# Patient Record
Sex: Male | Born: 1965 | Hispanic: No | Marital: Married | Smoking: Never smoker
Health system: Southern US, Community
[De-identification: ages and names within clinical notes are randomized; demographics above are authoritative.]

## PROBLEM LIST (undated history)

## (undated) DIAGNOSIS — I10 Essential (primary) hypertension: Secondary | ICD-10-CM

## (undated) DIAGNOSIS — N200 Calculus of kidney: Secondary | ICD-10-CM

## (undated) HISTORY — DX: Essential (primary) hypertension: I10

## (undated) HISTORY — DX: Calculus of kidney: N20.0

---

## 2001-02-23 ENCOUNTER — Emergency Department (HOSPITAL_COMMUNITY): Admission: EM | Admit: 2001-02-23 | Discharge: 2001-02-23 | Payer: Self-pay | Admitting: Emergency Medicine

## 2008-05-05 ENCOUNTER — Ambulatory Visit (HOSPITAL_COMMUNITY): Admission: RE | Admit: 2008-05-05 | Discharge: 2008-05-05 | Payer: Self-pay | Admitting: Urology

## 2009-09-22 IMAGING — CT CT ABD-PELV W/O CM
4 of 8 series · 12 of 46 positions shown, 19 images · non-contrast
Comparison: NONE

CLINICAL DATA: Abdominal pain times 2 days. 

CT ABDOMEN AND PELVIS WITHOUT AND WITH INTRAVENOUS CONTRAST
TECHNIQUE: Multiple axial images were obtained from the 
diaphragm through the pelvis. COMPARISON:None.

[Series 4: venous · axial · portal-venous · 0.67mm/px · z∈[+686,+956]mm · 6 of 76 slices shown, 11 images]
[im 11/76  soft-tissue]
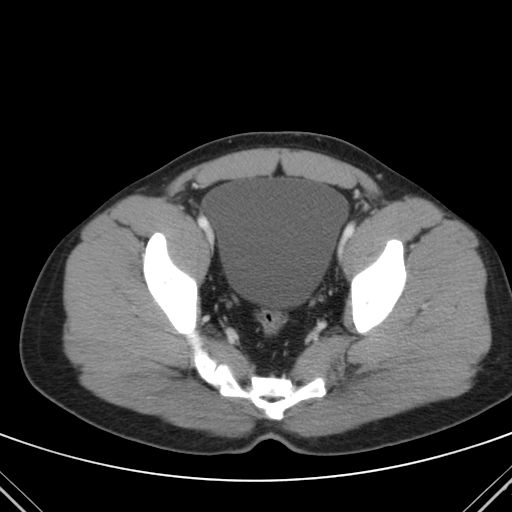
[im 11/76  bone]
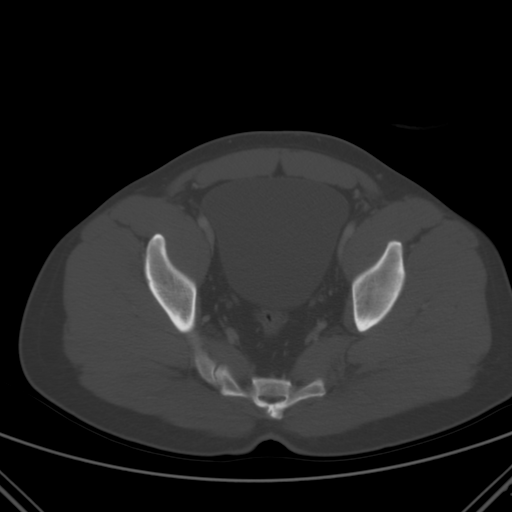
[im 22/76  soft-tissue]
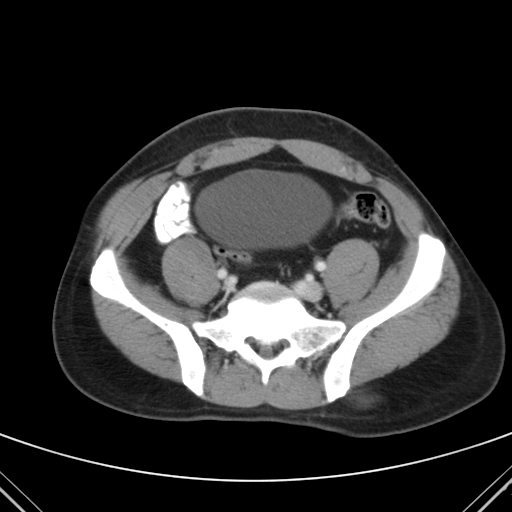
[im 33/76  soft-tissue]
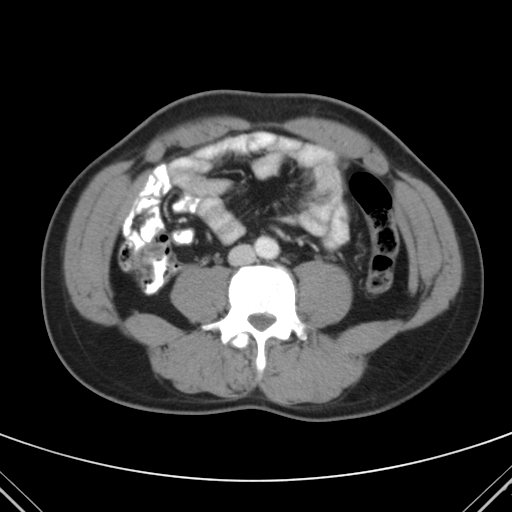
[im 33/76  lung]
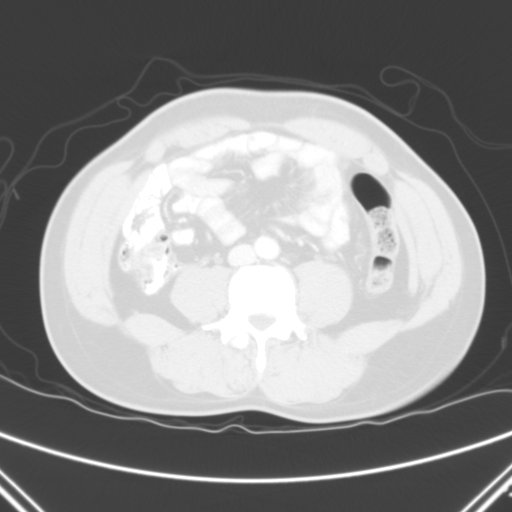
[im 43/76  soft-tissue]
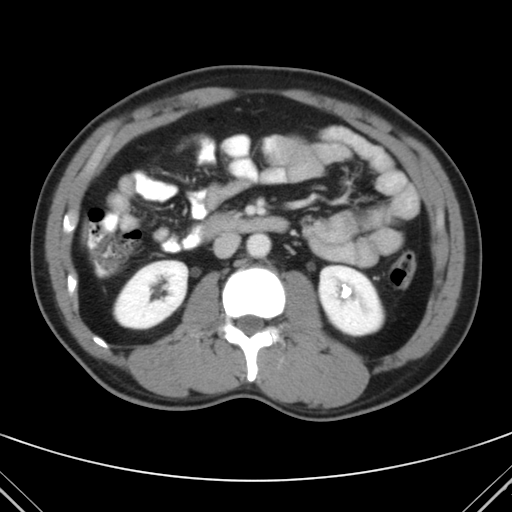
[im 43/76  lung]
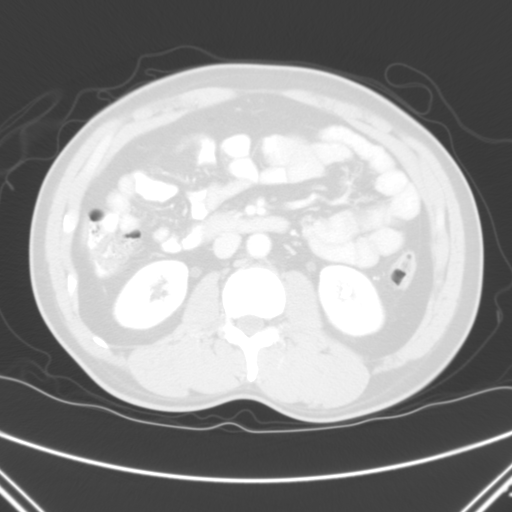
[im 54/76  soft-tissue]
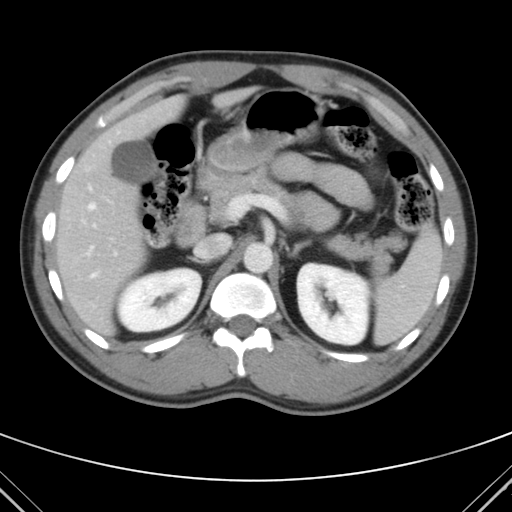
[im 54/76  lung]
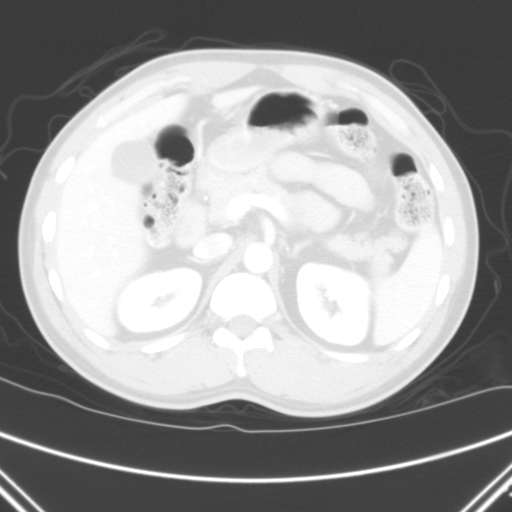
[im 65/76  soft-tissue]
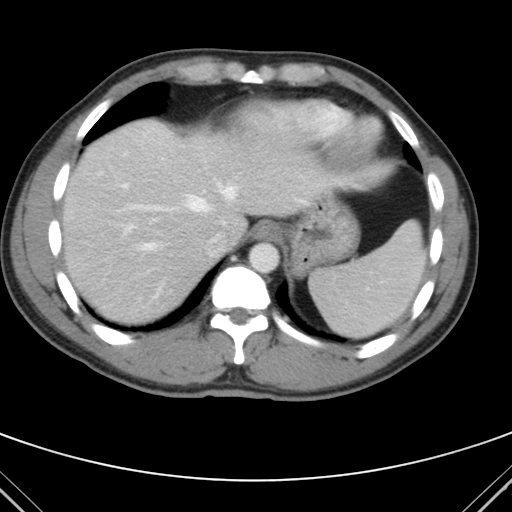
[im 65/76  lung]
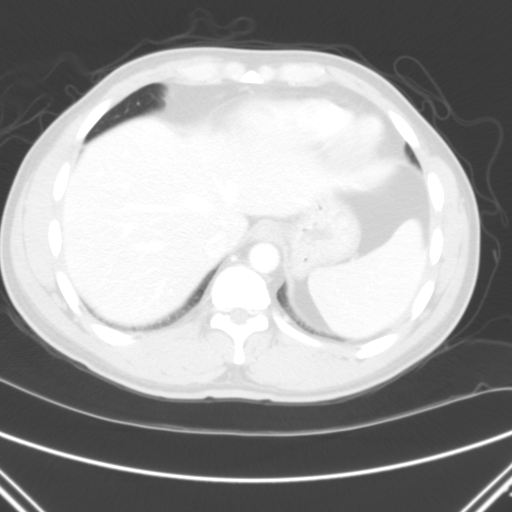

[Series 9: delays · axial · 0.68mm/px · z∈[+686,+741]mm · 2 of 76 slices shown]
[im 11/76  soft-tissue]
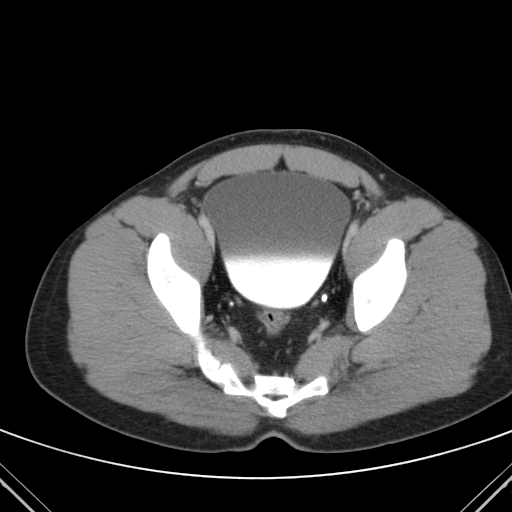
[im 22/76  soft-tissue]
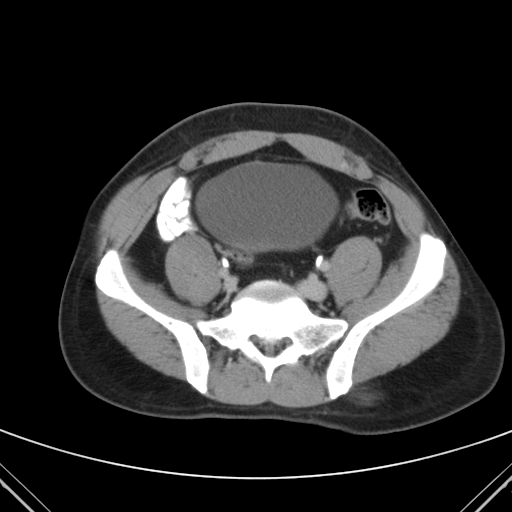

[Series 8050: coronals · coronal · 0.73mm/px · 3 of 81 slices shown, 4 images]
[im 27/81  soft-tissue]
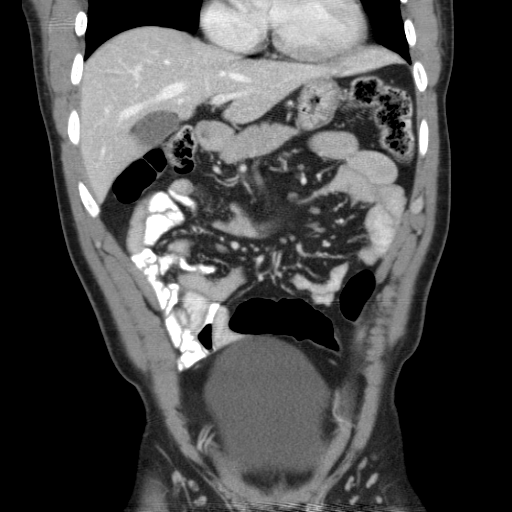
[im 36/81  soft-tissue]
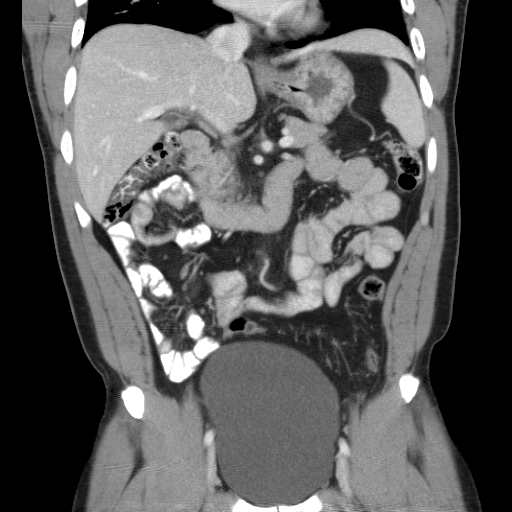
[im 36/81  bone]
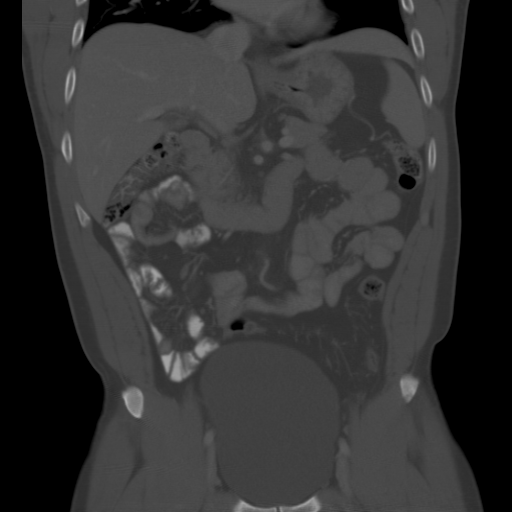
[im 45/81  soft-tissue]
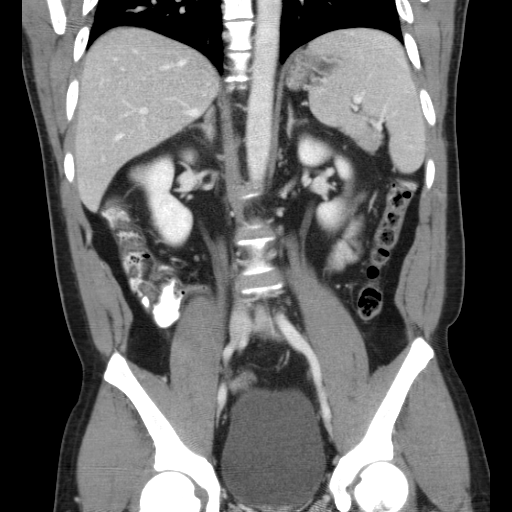

[Series 8051: sagittals · sagittal · 0.73mm/px · 1 of 96 slices shown, 2 images]
[im 32/96  soft-tissue]
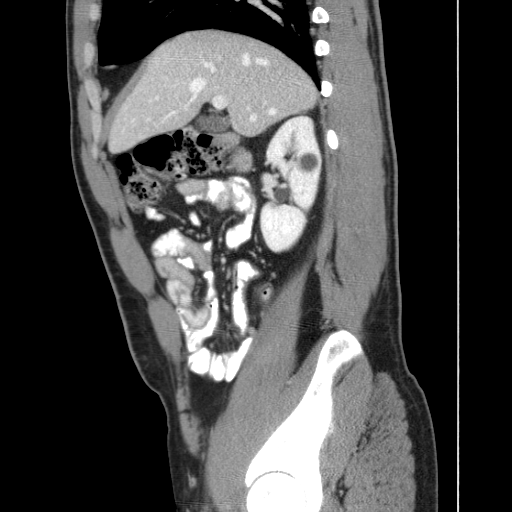
[im 32/96  bone]
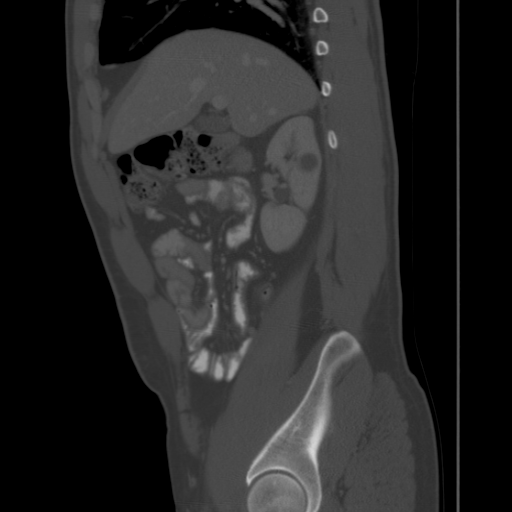

[12 of 46 positions shown; findings below may reference images not displayed]

FINDINGS: Lung bases and pleural cavities are normal.  The liver, 
spleen, pancreas, and gallbladder are normal.  Multiple renal 
cysts; the largest measures 2.5 cm.  Several nonobstructing stones 
in the lower pole of the left kidney; the largest measures 8.0 mm. 
 Normal appendix.  No adenopathy or ascites.  No bowel obstruction 
or diverticulosis.  Mild multilevel lumbar spondylosis without 
fractures or bone destruction.
IMPRESSION: Bilateral renal cysts.  Nonobstructing left renal 
calculi. No acute disease. Zegulina, Karolina Misiulyte., M.D. Ali Poloko Bw Sarefo 
04/11/08Trans Date: 04/11/2008 RTOYOTA JOSHJAX  JLM

## 2010-04-20 ENCOUNTER — Emergency Department (HOSPITAL_COMMUNITY): Admission: EM | Admit: 2010-04-20 | Discharge: 2010-04-20 | Payer: Self-pay | Admitting: Family Medicine

## 2010-11-26 LAB — POCT URINALYSIS DIPSTICK
Glucose, UA: NEGATIVE mg/dL
Ketones, ur: NEGATIVE mg/dL
Specific Gravity, Urine: 1.02 (ref 1.005–1.030)

## 2011-05-30 ENCOUNTER — Ambulatory Visit (HOSPITAL_COMMUNITY)
Admission: RE | Admit: 2011-05-30 | Discharge: 2011-05-30 | Disposition: A | Discharge status: Home / Self Care | Payer: BC Managed Care – PPO | Source: Ambulatory Visit | Attending: Urology | Admitting: Urology

## 2011-05-30 DIAGNOSIS — N200 Calculus of kidney: Secondary | ICD-10-CM | POA: Insufficient documentation

## 2012-11-09 IMAGING — CR DG ABDOMEN 1V
1 series · 1 of 1 positions shown · non-contrast
Comparison: 04/19/2011 CT

CLINICAL DATA: Preoperative radiograph, left renal stone.

ABDOMEN - 1 VIEW

[t abdomen supine]
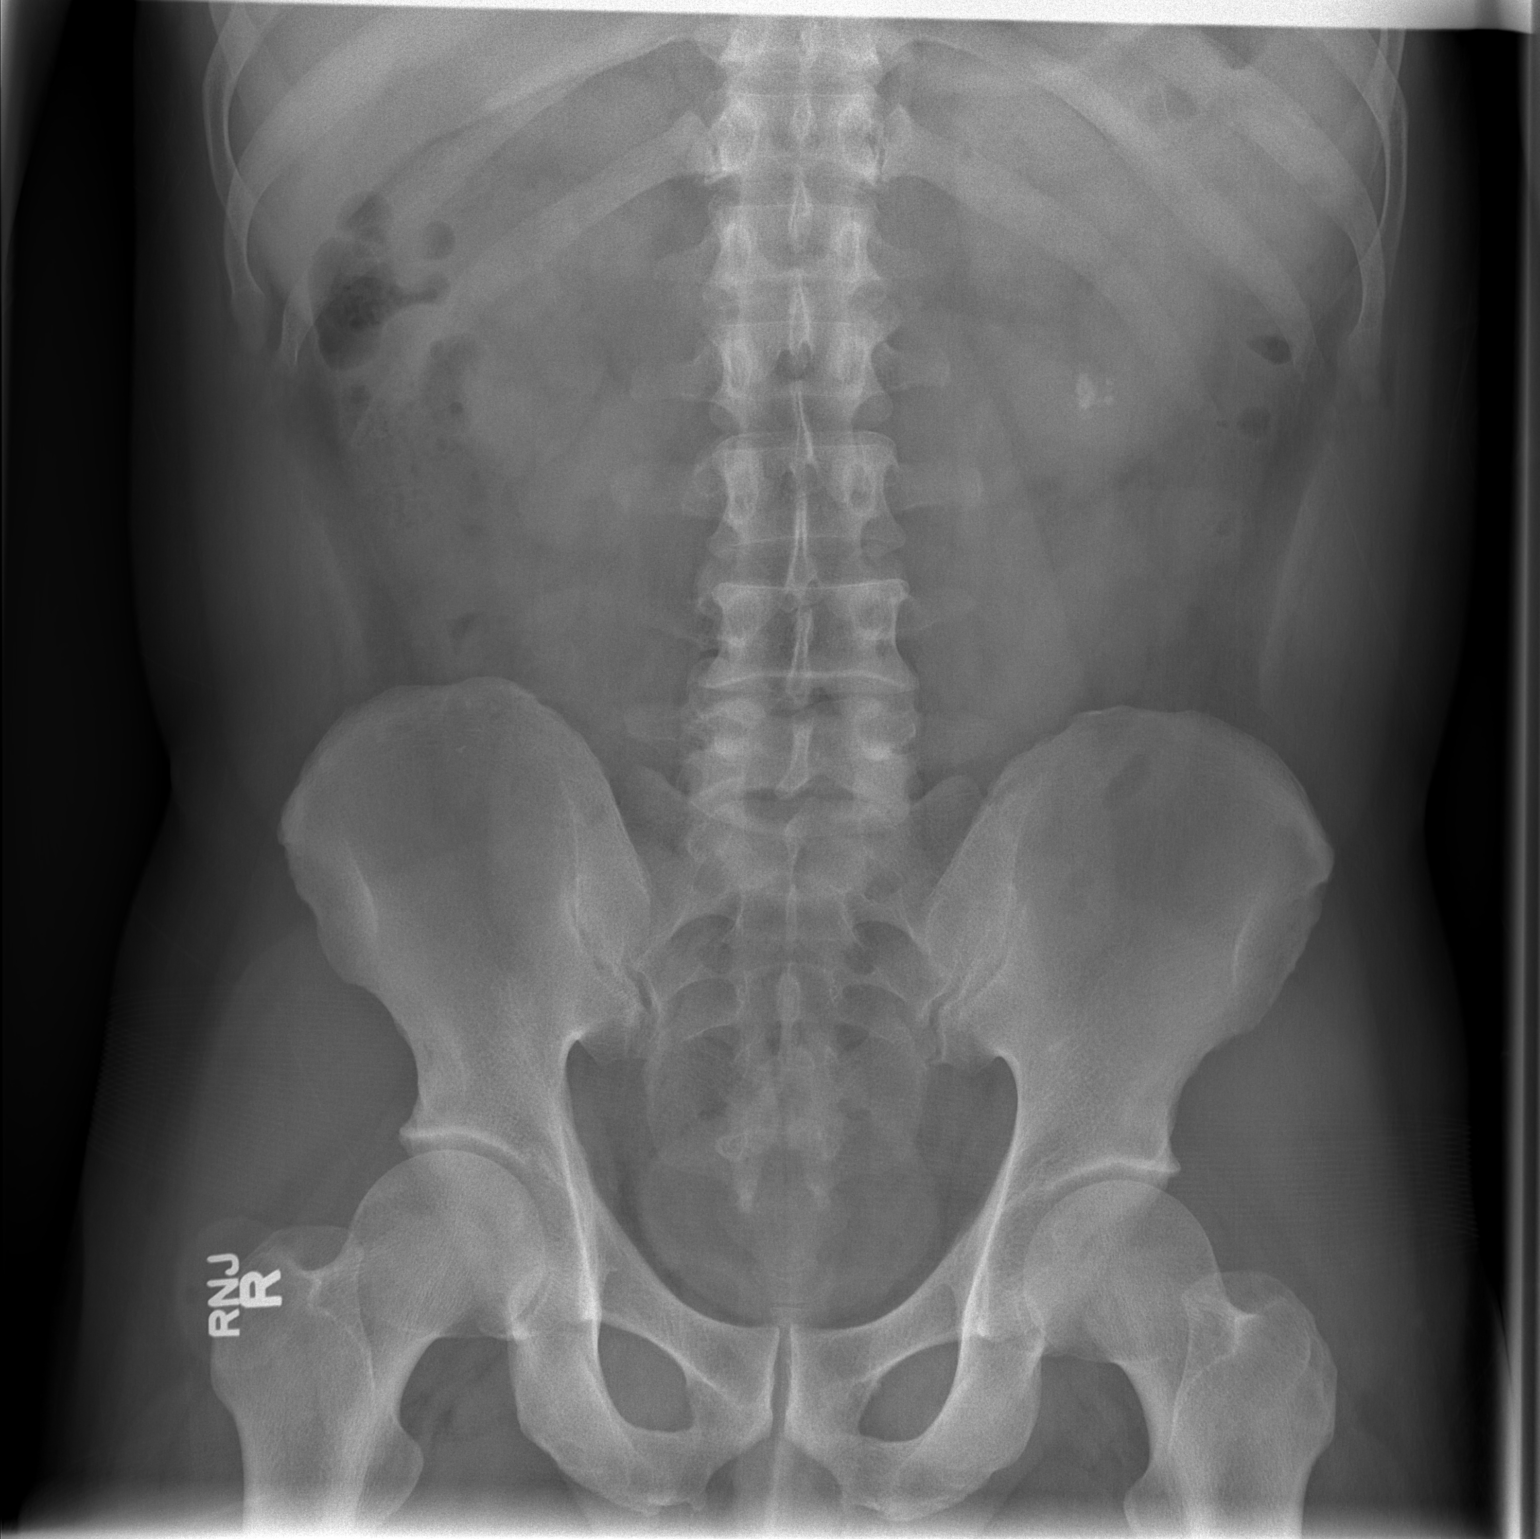

[1 of 1 positions shown; findings below may reference images not displayed]

FINDINGS: There are several calcific densities projecting over the
lower pole left renal shadow, the largest of which measures 11 mm.
Nonobstructive bowel gas pattern.  Organ outlines normal where seen
otherwise.  No acute osseous abnormality.
IMPRESSION: Several calcific densities projecting over the lower pole left
kidney, the largest of which measures 11 mm.

## 2014-07-25 ENCOUNTER — Ambulatory Visit (INDEPENDENT_AMBULATORY_CARE_PROVIDER_SITE_OTHER): Payer: BC Managed Care – PPO | Admitting: Emergency Medicine

## 2014-07-25 VITALS — BP 132/90 | HR 75 | Temp 98.2°F | Resp 18 | Ht 61.0 in | Wt 151.2 lb

## 2014-07-25 DIAGNOSIS — I10 Essential (primary) hypertension: Secondary | ICD-10-CM

## 2014-07-25 LAB — LIPID PANEL
CHOLESTEROL: 160 mg/dL (ref 0–200)
HDL: 40 mg/dL (ref >39)
LDL CALC: 85 mg/dL (ref 0–99)
Total CHOL/HDL Ratio: 4 Ratio
Triglycerides: 174 mg/dL — ABNORMAL HIGH (ref <150)
VLDL: 35 mg/dL (ref 0–40)

## 2014-07-25 LAB — POCT CBC
GRANULOCYTE PERCENT: 63.7 % (ref 37–80)
HEMATOCRIT: 42 % — AB (ref 43.5–53.7)
HEMOGLOBIN: 12.7 g/dL — AB (ref 14.1–18.1)
Lymph, poc: 2.1 (ref 0.6–3.4)
MCH, POC: 22.2 pg — AB (ref 27–31.2)
MCHC: 30.2 g/dL — AB (ref 31.8–35.4)
MCV: 73.5 fL — AB (ref 80–97)
MID (cbc): 0.5 (ref 0–0.9)
MPV: 7.9 fL (ref 0–99.8)
POC GRANULOCYTE: 4.5 (ref 2–6.9)
POC LYMPH PERCENT: 29.9 %L (ref 10–50)
POC MID %: 6.4 %M (ref 0–12)
Platelet Count, POC: 257 10*3/uL (ref 142–424)
RBC: 5.72 M/uL (ref 4.69–6.13)
RDW, POC: 13.7 %
WBC: 7.1 10*3/uL (ref 4.6–10.2)

## 2014-07-25 LAB — POCT URINALYSIS DIPSTICK
Bilirubin, UA: NEGATIVE
GLUCOSE UA: NEGATIVE
KETONES UA: NEGATIVE
LEUKOCYTES UA: NEGATIVE
Nitrite, UA: NEGATIVE
PROTEIN UA: NEGATIVE
SPEC GRAV UA: 1.02
Urobilinogen, UA: 0.2
pH, UA: 6

## 2014-07-25 LAB — COMPREHENSIVE METABOLIC PANEL
ALBUMIN: 4.5 g/dL (ref 3.5–5.2)
ALK PHOS: 79 U/L (ref 39–117)
ALT: 31 U/L (ref 0–53)
AST: 23 U/L (ref 0–37)
BILIRUBIN TOTAL: 0.9 mg/dL (ref 0.2–1.2)
BUN: 11 mg/dL (ref 6–23)
CO2: 27 meq/L (ref 19–32)
Calcium: 9.4 mg/dL (ref 8.4–10.5)
Chloride: 102 mEq/L (ref 96–112)
Creat: 0.81 mg/dL (ref 0.50–1.35)
GLUCOSE: 82 mg/dL (ref 70–99)
POTASSIUM: 3.8 meq/L (ref 3.5–5.3)
SODIUM: 140 meq/L (ref 135–145)
TOTAL PROTEIN: 7.9 g/dL (ref 6.0–8.3)

## 2014-07-25 MED ORDER — LISINOPRIL 20 MG PO TABS: 20.0000 mg | ORAL_TABLET | Freq: Every day | ORAL | Status: DC

## 2014-07-25 NOTE — Progress Notes (Signed)
[facility] [address], [place] [id number] [phone]  Date:  07/25/2014   Name:  Daniel Dougherty   DOB:  01/08/1966   MRN:  [id number]  PCP:  No PCP Per Patient    Chief Complaint: Hypertension   History of Present Illness:  Daniel Dougherty is a 48 y.o. very pleasant male patient who presents with the following:  Patient has a written record of elevated blood pressures since June ranging in the 160/104 range consistently Over the past month, he has developed global headaches that are persistent and are intermittently associated with mild dizziness He describes no neuro or visual symptoms.  No chest pain or shortness of breath. No improvement with over the counter medications or other home remedies. Denies other complaint or health concern today.   There are no active problems to display for this patient.   Past Medical History  Diagnosis Date  . Hypertension   . Kidney stones     History reviewed. No pertinent past surgical history.  History  Substance Use Topics  . Smoking status: Never Smoker   . Smokeless tobacco: Not on file  . Alcohol Use: 0.0 oz/week    0 Not specified per week     Comment: Sometimes    History reviewed. No pertinent family history.  No Known Allergies  Medication list has been reviewed and updated.  No current outpatient prescriptions on file prior to visit.   No current facility-administered medications on file prior to visit.    Review of Systems:  As per HPI, otherwise negative.    Physical Examination: Filed Vitals:   07/25/14 0855  BP: 132/90  Pulse: 75  Temp: 98.2 F (36.8 C)  Resp: 18   Filed Vitals:   07/25/14 0855  Height: 5\' 1"  (1.549 m)  Weight: 151 lb 3.2 oz (68.584 kg)   Body mass index is 28.58 kg/(m^2). Ideal Body Weight: Weight in (lb) to have BMI = 25: 132  GEN: WDWN, NAD, Non-toxic, A & O x 3 HEENT: Atraumatic, Normocephalic. Neck supple. No masses, No LAD. Ears and Nose: No external  deformity. CV: RRR, No M/G/R. No JVD. No thrill. No extra heart sounds. PULM: CTA B, no wheezes, crackles, rhonchi. No retractions. No resp. distress. No accessory muscle use. ABD: S, NT, ND, +BS. No rebound. No HSM. EXTR: No c/c/e NEURO Normal gait.  PSYCH: Normally interactive. Conversant. Not depressed or anxious appearing.  Calm demeanor.    Assessment and Plan: Hypertension  Signed,  Phillips OdorJeffery Wynter Grave, MD

## 2014-07-25 NOTE — Patient Instructions (Signed)
T?ng huy?t p (Hypertension) T?ng huy?t p, th??ng ???c g?i l huy?t p cao, l khi l?c b?m mu qua ??ng m?ch c?a qu v? qu m?nh. ??ng m?ch c?a qu v? l cc m?ch mu mang mu t? tim ?i kh?p c? th? c?a qu v?. K?t qu? ?o huy?t p c m?t con s? cao v m?t con s? th?p, ch?ng h?n 110/72. Con s? cao (tm thu) l p l?c bn trong ??ng m?ch khi tim qu v? b?m. Con s? th?p (tm tr??ng) l p l?c bn trong ??ng m?ch khi tim qu v? gin ra. Huy?t p l t??ng c?n cho qu v? ph?i l d??i 120/80. Ch?ng t?ng huy?t p bu?c tim qu v? ph?i lm vi?c v?t v? h?n ?? b?m mu. ??ng m?ch c?a qu v? c th? b? h?p ho?c c?ng. Ch?ng t?ng huy?t p lm qu v? c nguy c? b? b?nh tim, ??t qu? v cc v?n ?? khc.  CC Y?U T? NGUY C? M?t s? y?u t? nguy c? d?n ??n huy?t p cao c th? ki?m sot ???c. M?t s? y?u t? khc th khng.  Nh?ng y?u t? nguy c? khng th? ki?m sot ???c bao g?m:   Ch?ng t?c. Qu v? c nguy c? cao h?n n?u qu v? l ng??i M? g?c Phi.  ?? tu?i. Nguy c? t?ng ln theo ?? tu?i.  Gi?i tnh. Nam gi?i c nguy c? cao h?n ph? n? tr??c tu?i 45. Sau tu?i 65, ph? n? c nguy c? cao h?n nam gi?i. Nh?ng y?u t? nguy c? c th? ki?m sot ???c bao g?m:  Khng t?p th? d?c ho?c cc ho?t ??ng th? ch?t ??y ??.  Th?a cn.  ?n qu nhi?u ch?t bo, ???ng, ca-lo, ho?c mu?i.  U?ng qu nhi?u r??u. D?U HI?U V TRI?U CH?NG T?ng huy?t p th??ng khng gy ra d?u hi?u ho?c tri?u ch?ng. Huy?t p r?t cao (c?n cao huy?t p) c th? gy ?au ??u, lo l?ng, kh th?, v ch?y mu cam. CH?N ?ON  ?? ki?m tra xem qu v? c t?ng huy?t p khng, chuyn gia ch?m sc s?c kh?e c?a qu v? s? ?o huy?t p trong khi qu v? ng?i ??t tay ? m?c ngang v?i tim. Huy?t p c?n ???c ?o t nh?t hai l?n trn cng m?t cnh tay. M?t s? tnh tr?ng nh?t ??nh c th? lm cho huy?t p khc nhau gi?a tay ph?i v tay tri c?a qu v?. K?t qu? ?o huy?t p cao h?n bnh th??ng ? m?t th?i ?i?m no ? khng c ngh?a l qu v? c?n ?i?u tr?. N?u k?t qu? ?o huy?t p cao, hy h?i chuyn  gia ch?m sc s?c kh?e v? vi?c ki?m tra l?i huy?t p. ?I?U TR?  ?i?u tr? huy?t p cao gao g?m thay ??i l?i s?ng v c th? ph?i dng thu?c. C m?t l?i s?ng lnh m?nh c th? gip lm gi?m huy?t p cao. Qu v? c th? c?n thay ??i m?t s? thi quen. Thay ??i l?i s?ng c th? bao g?m:  Th?c hi?n ch? ?? ?n DASH. Ch? ?? ?n ny c nhi?u tri cy, rau, v ng? c?c nguyn h?t. C t mu?i, th?t ??, v t b? sung ???ng.  Hy dnh 2 1/2 ti?ng ho?t ??ng thn th? nhanh m?i tu?n.  Gi?m cn n?u c?n thi?t.  Khng ht thu?c.  H?n ch? ?? u?ng c c?n.  H?c cc cch gi?m c?ng th?ng. N?u thay ??i l?i s?ng khng ?? ?? ??a huy?t p v? m?c c th? ki?m sot ???  c, chuyn gia ch?m sc s?c kh?e c th? k ??n thu?c. Qu v? c th? c?n dng nhi?u lo?i thu?c. Ph?i h?p ch?t ch? v?i chuyn gia ch?m sc s?c kh?e ?? tm hi?u cc nguy c? v l?i ch. H??NG D?N CH?M SC T?I NH  Ki?m tra l?i huy?t p c?a qu v? theo ch? d?n c?a chuyn gia ch?m sc s?c kh?e.  Ch? s? d?ng thu?c theo ch? d?n c?a chuyn gia ch?m sc s?c kh?e. Lm theo ch? d?n m?t cch c?n th?n. Thu?c ?i?u tr? huy?t p ph?i ???c dng theo ??n ? k. Thu?c c?ng s? khng c tc d?ng khi qu v? b? li?u. Vi?c b? li?u thu?c c?ng lm qu v? c nguy c? pht sinh v?n ??.  Khng ht thu?c.  Theo di huy?t p c?a qu v? ? nh theo ch? d?n c?a chuyn gia ch?m sc s?c kh?e. ?I KHM N?U:   Qu v? ngh? qu v? c ph?n ?ng v?i thu?c ?ang dng.  Qu v? b? ?au ??u ho?c c?m th?y chng m?t ti di?n.  Qu v? b? s?ng ph ? m?t c chn.  Qu v? c v?n ?? v? th? l?c. NGAY L?P T?C ?I KHM N?U:  Qu v? b? ?au ??u n?ng ho?c l l?n.  Qu v? b? y?u b?t th??ng, t b, ho?c c?m th?y nh? ng?t x?u.  Qu v? b? ?au ng?c ho?c ?au b?ng r?t nhi?u.  Qu v? nn nhi?u l?n.  Qu v? b? kh th?. ??M B?O QU V?:   Hi?u r cc h??ng d?n ny.  S? theo di tnh tr?ng c?a mnh.  S? yu c?u tr? gip ngay l?p t?c n?u qu v? c?m th?y khng kh?e ho?c th?y tr?m tr?ng h?n. Document Released:  08/29/2005 Document Revised: 01/13/2014 Exit[employer] Patient Information 2015 [employer]. This information is not intended to replace advice given to you by your health care provider. Make sure you discuss any questions you have with your health care provider.  

## 2015-05-15 ENCOUNTER — Ambulatory Visit (INDEPENDENT_AMBULATORY_CARE_PROVIDER_SITE_OTHER): Payer: Self-pay | Admitting: Emergency Medicine

## 2015-05-15 ENCOUNTER — Other Ambulatory Visit: Payer: Self-pay | Admitting: Physician Assistant

## 2015-05-15 VITALS — BP 130/84 | HR 65 | Temp 98.4°F | Resp 16 | Ht 61.0 in | Wt 147.0 lb

## 2015-05-15 DIAGNOSIS — R42 Dizziness and giddiness: Secondary | ICD-10-CM

## 2015-05-15 DIAGNOSIS — Z87442 Personal history of urinary calculi: Secondary | ICD-10-CM

## 2015-05-15 DIAGNOSIS — Z8679 Personal history of other diseases of the circulatory system: Secondary | ICD-10-CM

## 2015-05-15 LAB — CBC WITH DIFFERENTIAL/PLATELET
BASOS ABS: 0 10*3/uL (ref 0.0–0.1)
Basophils Relative: 0 % (ref 0–1)
EOS PCT: 2 % (ref 0–5)
Eosinophils Absolute: 0.1 10*3/uL (ref 0.0–0.7)
HEMATOCRIT: 40.9 % (ref 39.0–52.0)
Hemoglobin: 12.8 g/dL — ABNORMAL LOW (ref 13.0–17.0)
LYMPHS PCT: 34 % (ref 12–46)
Lymphs Abs: 2.5 10*3/uL (ref 0.7–4.0)
MCH: 22.5 pg — ABNORMAL LOW (ref 26.0–34.0)
MCHC: 31.3 g/dL (ref 30.0–36.0)
MCV: 71.9 fL — ABNORMAL LOW (ref 78.0–100.0)
MPV: 10.3 fL (ref 8.6–12.4)
Monocytes Absolute: 0.5 10*3/uL (ref 0.1–1.0)
Monocytes Relative: 7 % (ref 3–12)
Neutro Abs: 4.2 10*3/uL (ref 1.7–7.7)
Neutrophils Relative %: 57 % (ref 43–77)
PLATELETS: 299 10*3/uL (ref 150–400)
RBC: 5.69 MIL/uL (ref 4.22–5.81)
RDW: 14.5 % (ref 11.5–15.5)
WBC: 7.3 10*3/uL (ref 4.0–10.5)

## 2015-05-15 LAB — POCT UA - MICROSCOPIC ONLY
Bacteria, U Microscopic: NEGATIVE
Casts, Ur, LPF, POC: NEGATIVE
Crystals, Ur, HPF, POC: NEGATIVE
Epithelial cells, urine per micros: NEGATIVE
MUCUS UA: NEGATIVE
WBC, UR, HPF, POC: NEGATIVE
YEAST UA: NEGATIVE

## 2015-05-15 LAB — COMPLETE METABOLIC PANEL WITH GFR
ALT: 26 U/L (ref 9–46)
AST: 19 U/L (ref 10–40)
Albumin: 4.3 g/dL (ref 3.6–5.1)
Alkaline Phosphatase: 69 U/L (ref 40–115)
BUN: 14 mg/dL (ref 7–25)
CALCIUM: 9.4 mg/dL (ref 8.6–10.3)
CHLORIDE: 100 mmol/L (ref 98–110)
CO2: 27 mmol/L (ref 20–31)
CREATININE: 0.9 mg/dL (ref 0.60–1.35)
GFR, Est Non African American: >89 mL/min (ref >=60)
Glucose, Bld: 78 mg/dL (ref 65–99)
POTASSIUM: 4 mmol/L (ref 3.5–5.3)
Sodium: 140 mmol/L (ref 135–146)
Total Bilirubin: 0.9 mg/dL (ref 0.2–1.2)
Total Protein: 7.6 g/dL (ref 6.1–8.1)

## 2015-05-15 LAB — POCT URINALYSIS DIPSTICK
Bilirubin, UA: NEGATIVE
Glucose, UA: NEGATIVE
Ketones, UA: NEGATIVE
LEUKOCYTES UA: NEGATIVE
NITRITE UA: NEGATIVE
PH UA: 5.5
PROTEIN UA: NEGATIVE
Spec Grav, UA: 1.02
UROBILINOGEN UA: 0.2

## 2015-05-15 LAB — POCT GLYCOSYLATED HEMOGLOBIN (HGB A1C): HEMOGLOBIN A1C: 4.5

## 2015-05-15 LAB — TSH: TSH: 0.443 u[IU]/mL (ref 0.350–4.500)

## 2015-05-15 NOTE — Progress Notes (Signed)
  Medical screening examination/treatment/procedure(s) were performed by non-physician practitioner and as supervising physician I was immediately available for consultation/collaboration.     

## 2015-05-15 NOTE — Patient Instructions (Signed)
I do not know why you are getting dizzy.  Please check you blood pressure when you are feeling symptoms and record this and bring this back to the office in one week.

## 2015-05-15 NOTE — Progress Notes (Signed)
05/15/2015 at 11:55 AM  Daniel Dougherty / DOB: 1965-12-27 / MRN: [id number]  The patient  does not have a problem list on file.  SUBJECTIVE  Daniel Dougherty is a 49 y.o. well appearing male presenting for the chief complaint of "head feels heavy" dizziness that occurs mostly in the morning times and last roughly thirty minutes. He denies presyncope and vertiginous symptoms with this dizziness, as well as chest pain and palpitations. He is able to function through this dizziness and is able to make coffee and perform his normal morning routine. Most recent episode was this morning and this occurs three or four times per week.  States he drinks 1-2 beers daily and sometimes more on the weekends. He does not smoke or use illlicits. He has a history of HTN and has taken Lisinopril in the past, however has not been taking this for roughly 1 year now.    He  has a past medical history of Hypertension and Kidney stones.    Medications reviewed and updated by myself where necessary, and exist elsewhere in the encounter.   [name] has No Known Allergies. He  reports that he has never smoked. He does not have any smokeless tobacco history on file. He reports that he drinks alcohol. He reports that he does not use illicit drugs. He  has no sexual activity history on file. The patient  has no past surgical history on file.  His family history is not on file.  Review of Systems  Constitutional: Negative for fever.  Eyes: Negative for blurred vision and photophobia.  Respiratory: Negative for cough.   Cardiovascular: Negative for chest pain.  Gastrointestinal: Negative for nausea.  Genitourinary: Negative for dysuria.  Skin: Negative for rash.  Neurological: Positive for dizziness (transient). Negative for focal weakness and headaches.  Endo/Heme/Allergies: Negative for polydipsia.    OBJECTIVE  His  height is 5\' 1"  (1.549 m) and weight is 147 lb (66.679 kg). His oral temperature is 98.4 F (36.9 C). His blood  pressure is 130/84 and his pulse is 65. His respiration is 16 and oxygen saturation is 99%.  The patient's body mass index is 27.79 kg/(m^2).  Physical Exam  Vitals reviewed. Constitutional: He is oriented to person, place, and time. He appears well-developed. No distress.  Eyes: EOM are normal. Pupils are equal, round, and reactive to light. No scleral icterus.  Neck: Normal range of motion.  Cardiovascular: Normal rate and regular rhythm.   Respiratory: Effort normal and breath sounds normal.  GI: He exhibits no distension.  Musculoskeletal: Normal range of motion.  Neurological: He is alert and oriented to person, place, and time. He has normal strength and normal reflexes. He displays no tremor. No cranial nerve deficit or sensory deficit. He exhibits normal muscle tone. Gait normal. GCS eye subscore is 4. GCS verbal subscore is 5. GCS motor subscore is 6.  Skin: Skin is warm and dry. No rash noted. He is not diaphoretic.  Psychiatric: He has a normal mood and affect.    Results for orders placed or performed in visit on 05/15/15 (from the past 24 hour(s))  POCT glycosylated hemoglobin (Hb A1C)     Status: None   Collection Time: 05/15/15 11:39 AM  Result Value Ref Range   Hemoglobin A1C 4.5   POCT UA - Microscopic Only     Status: None   Collection Time: 05/15/15 11:40 AM  Result Value Ref Range   WBC, Ur, HPF, POC neg'  RBC, urine, microscopic 3-6    Bacteria, U Microscopic neg    Mucus, UA neg    Epithelial cells, urine per micros neg    Crystals, Ur, HPF, POC neg    Casts, Ur, LPF, POC neg    Yeast, UA neg   POCT urinalysis dipstick     Status: None   Collection Time: 05/15/15 11:40 AM  Result Value Ref Range   Color, UA yellow    Clarity, UA clear    Glucose, UA neg    Bilirubin, UA neg    Ketones, UA neg    Spec Grav, UA 1.020    Blood, UA small    pH, UA 5.5    Protein, UA neg    Urobilinogen, UA 0.2    Nitrite, UA neg    Leukocytes, UA Negative Negative    Orthostatic VS for the past 24 hrs:  BP- Lying Pulse- Lying BP- Sitting Pulse- Sitting BP- Standing at 0 minutes Pulse- Standing at 0 minutes  05/15/15 1145 152/89 mmHg 64 (!) 155/95 mmHg 67 (!) 149/91 mmHg 76   EKG: NSR.     ASSESSMENT & PLAN  Mykal was seen today for hypertension.  Diagnoses and all orders for this visit:  Dizziness: His symptoms are non specific. His work up is reassuring. -     EKG 12-Lead -     COMPLETE METABOLIC PANEL WITH GFR -     CBC with Differential/Platelet -     TSH -     POCT glycosylated hemoglobin (Hb A1C)  History of hypertension: Patient advised to monitor his BP daily for the next 7 days, both while symptomatic and asymptomatic and return this to me.  I BP log and envelope has been provided.  He does not need to return to the office.    History of kidney stones: Patient under the care of a urologist.   -     POCT UA - Microscopic Only -     POCT urinalysis dipstick    The patient was advised to call or come back to clinic if he does not see an improvement in symptoms, or worsens with the above plan.   Deliah Boston, MHS, PA-C [facility] 05/15/2015 11:55 AM

## 2015-05-19 LAB — IRON AND TIBC
%SAT: 39 % (ref 15–60)
IRON: 140 ug/dL (ref 50–180)
TIBC: 356 ug/dL (ref 250–425)
UIBC: 216 ug/dL (ref 125–400)

## 2015-06-02 ENCOUNTER — Other Ambulatory Visit: Payer: Self-pay | Admitting: Physician Assistant

## 2015-06-02 DIAGNOSIS — D509 Iron deficiency anemia, unspecified: Secondary | ICD-10-CM

## 2015-06-05 ENCOUNTER — Other Ambulatory Visit: Payer: Self-pay | Admitting: Physician Assistant

## 2015-06-05 ENCOUNTER — Other Ambulatory Visit: Payer: Self-pay

## 2015-06-05 DIAGNOSIS — I10 Essential (primary) hypertension: Secondary | ICD-10-CM

## 2015-06-05 DIAGNOSIS — D509 Iron deficiency anemia, unspecified: Secondary | ICD-10-CM

## 2015-06-05 MED ORDER — LISINOPRIL 5 MG PO TABS: 5.0000 mg | ORAL_TABLET | Freq: Every day | ORAL | Status: DC

## 2015-06-05 NOTE — Progress Notes (Signed)
Patient returned BP log with BP averaging 136/91.  Will start low dose lisinopril.  Deliah Boston, MS, PA-C   10:04 AM, 06/05/2015

## 2015-06-08 ENCOUNTER — Other Ambulatory Visit: Payer: Self-pay | Admitting: Physician Assistant

## 2015-06-08 LAB — PATHOLOGIST SMEAR REVIEW

## 2015-06-09 ENCOUNTER — Other Ambulatory Visit: Payer: Self-pay | Admitting: Physician Assistant

## 2015-06-09 DIAGNOSIS — D569 Thalassemia, unspecified: Secondary | ICD-10-CM | POA: Insufficient documentation

## 2016-08-08 ENCOUNTER — Other Ambulatory Visit: Payer: Self-pay | Admitting: Physician Assistant

## 2016-08-08 ENCOUNTER — Ambulatory Visit (INDEPENDENT_AMBULATORY_CARE_PROVIDER_SITE_OTHER): Payer: Managed Care, Other (non HMO) | Admitting: Physician Assistant

## 2016-08-08 VITALS — BP 140/90 | HR 66 | Temp 98.2°F | Resp 16 | Ht 61.0 in | Wt 146.6 lb

## 2016-08-08 DIAGNOSIS — R42 Dizziness and giddiness: Secondary | ICD-10-CM

## 2016-08-08 DIAGNOSIS — R1012 Left upper quadrant pain: Secondary | ICD-10-CM

## 2016-08-08 DIAGNOSIS — R030 Elevated blood-pressure reading, without diagnosis of hypertension: Secondary | ICD-10-CM

## 2016-08-08 DIAGNOSIS — R110 Nausea: Secondary | ICD-10-CM | POA: Diagnosis not present

## 2016-08-08 DIAGNOSIS — K529 Noninfective gastroenteritis and colitis, unspecified: Secondary | ICD-10-CM | POA: Diagnosis not present

## 2016-08-08 LAB — POCT CBC
Granulocyte percent: 76.1 %G (ref 37–80)
HEMATOCRIT: 38.1 % — AB (ref 43.5–53.7)
HEMOGLOBIN: 12.6 g/dL — AB (ref 14.1–18.1)
LYMPH, POC: 1.8 (ref 0.6–3.4)
MCH, POC: 23.5 pg — AB (ref 27–31.2)
MCHC: 33.2 g/dL (ref 31.8–35.4)
MCV: 70.8 fL — AB (ref 80–97)
MID (CBC): 0.6 (ref 0–0.9)
MPV: 7.5 fL (ref 0–99.8)
POC GRANULOCYTE: 7.6 — AB (ref 2–6.9)
POC LYMPH %: 18.3 % (ref 10–50)
POC MID %: 5.6 % (ref 0–12)
Platelet Count, POC: 248 10*3/uL (ref 142–424)
RBC: 5.38 M/uL (ref 4.69–6.13)
RDW, POC: 13.1 %
WBC: 10 10*3/uL (ref 4.6–10.2)

## 2016-08-08 LAB — COMPLETE METABOLIC PANEL WITH GFR
ALBUMIN: 4.5 g/dL (ref 3.6–5.1)
ALK PHOS: 60 U/L (ref 40–115)
ALT: 18 U/L (ref 9–46)
AST: 19 U/L (ref 10–35)
BILIRUBIN TOTAL: 1.1 mg/dL (ref 0.2–1.2)
BUN: 14 mg/dL (ref 7–25)
CALCIUM: 9.3 mg/dL (ref 8.6–10.3)
CO2: 28 mmol/L (ref 20–31)
CREATININE: 0.85 mg/dL (ref 0.70–1.33)
Chloride: 104 mmol/L (ref 98–110)
GFR, Est Non African American: >89 mL/min (ref >=60)
Glucose, Bld: 87 mg/dL (ref 65–99)
Potassium: 4.3 mmol/L (ref 3.5–5.3)
Sodium: 141 mmol/L (ref 135–146)
TOTAL PROTEIN: 7.6 g/dL (ref 6.1–8.1)

## 2016-08-08 LAB — POCT URINALYSIS DIP (MANUAL ENTRY)
BILIRUBIN UA: NEGATIVE
Bilirubin, UA: NEGATIVE
Glucose, UA: NEGATIVE
Leukocytes, UA: NEGATIVE
Nitrite, UA: NEGATIVE
SPEC GRAV UA: 1.025
Urobilinogen, UA: 0.2
pH, UA: 6

## 2016-08-08 LAB — TSH: TSH: 0.22 mIU/L — ABNORMAL LOW (ref 0.40–4.50)

## 2016-08-08 LAB — POC MICROSCOPIC URINALYSIS (UMFC)

## 2016-08-08 LAB — LIPASE: Lipase: 9 U/L (ref 7–60)

## 2016-08-08 MED ORDER — ONDANSETRON 4 MG PO TBDP: 4.0000 mg | ORAL_TABLET | Freq: Three times a day (TID) | ORAL | 0 refills | Status: DC | PRN

## 2016-08-08 MED ORDER — DIMENHYDRINATE 50 MG PO TABS: 50.0000 mg | ORAL_TABLET | Freq: Three times a day (TID) | ORAL | 0 refills | Status: DC | PRN

## 2016-08-08 NOTE — Patient Instructions (Addendum)
Drink at least 64 oz of water daily.  You can use zofran or dramamine as needed for dizziness and nausea. You can use OTC immodium for diarrhea if it comes back.  If your symptoms get worse seek care immediately otherwise I will see you this Friday for follow up.      Viral Gastroenteritis, Adult Introduction Viral gastroenteritis is also known as the stomach flu. This condition is caused by certain germs (viruses). These germs can be passed from person to person very easily (are very contagious). This condition can cause sudden watery poop (diarrhea), fever, and throwing up (vomiting). Having watery poop and throwing up can make you feel weak and cause you to get dehydrated. Dehydration can make you tired and thirsty, make you have a dry mouth, and make it so you pee (urinate) less often. Older adults and people with other diseases or a weak defense system (immune system) are at higher risk for dehydration. It is important to replace the fluids that you lose from having watery poop and throwing up. Follow these instructions at home: Follow instructions from your doctor about how to care for yourself at home. Eating and drinking Follow these instructions as told by your doctor:  Take an oral rehydration solution (ORS). This is a drink that is sold at pharmacies and stores.  Drink clear fluids in small amounts as you are able, such as:  Water.  Ice chips.  Diluted fruit juice.  Low-calorie sports drinks.  Eat bland, easy-to-digest foods in small amounts as you are able, such as:  Bananas.  Applesauce.  Rice.  Low-fat (lean) meats.  Toast.  Crackers.  Avoid fluids that have a lot of sugar or caffeine in them.  Avoid alcohol.  Avoid spicy or fatty foods. General instructions  Drink enough fluid to keep your pee (urine) clear or pale yellow.  Wash your hands often. If you cannot use soap and water, use hand sanitizer.  Make sure that all people in your home wash  their hands well and often.  Rest at home while you get better.  Take over-the-counter and prescription medicines only as told by your doctor.  Watch your condition for any changes.  Take a warm bath to help with any burning or pain from having watery poop.  Keep all follow-up visits as told by your doctor. This is important. Contact a doctor if:  You cannot keep fluids down.  Your symptoms get worse.  You have new symptoms.  You feel light-headed or dizzy.  You have muscle cramps. Get help right away if:  You have chest pain.  You feel very weak or you pass out (faint).  You see blood in your throw-up.  Your throw-up looks like coffee grounds.  You have bloody or black poop (stools) or poop that look like tar.  You have a very bad headache, a stiff neck, or both.  You have a rash.  You have very bad pain, cramping, or bloating in your belly (abdomen).  You have trouble breathing.  You are breathing very quickly.  Your heart is beating very quickly.  Your skin feels cold and clammy.  You feel confused.  You have pain when you pee.  You have signs of dehydration, such as:  Dark pee, hardly any pee, or no pee.  Cracked lips.  Dry mouth.  Sunken eyes.  Sleepiness.  Weakness. This information is not intended to replace advice given to you by your health care provider. Make sure you discuss  any questions you have with your health care provider. Document Released: 02/15/2008 Document Revised: 03/18/2016 Document Reviewed: 05/05/2015  2017 Elsevier  IF you received an x-ray today, you will receive an invoice from [facility]. Please contact [facility] at [phone] with questions or concerns regarding your invoice.   IF you received labwork today, you will receive an invoice from [facility]. Please contact [facility] at [phone] with questions or concerns regarding your invoice.   Our billing staff  will not be able to assist you with questions regarding bills from these companies.  You will be contacted with the lab results as soon as they are available. The fastest way to get your results is to activate your My Chart account. Instructions are located on the last page of this paperwork. If you have not heard from us regarding the results in 2 weeks, please contact this office.

## 2016-08-08 NOTE — Progress Notes (Signed)
Daniel Dougherty  MRN: [id number] DOB: 08/06/1966  Subjective:  Daniel Dougherty is a 50 y.o. male seen in office today for a chief complaint of dizziness x 1 day. He had associated vomiting and diarrhea yesterday. His son is having the same issues. He took some of his son's prescirbed medication and that helped with his diarrhea and vomiting but he cannot recall what the name of the medication is. However, it did not help with the dizziness. Notes the dizziness is triggered when he sits up from a lying positions. He has not drank water since yesterday afternoon.   Of note he does not take his bp medication for hypertension because it makes him cough so he stopped taking it one-two years ago. His bp runs 140-150s systolic.   Denies smoking. Drinks alcohol occasionally.   Accompanied by his daughter who is translating for patient.  Review of Systems  Constitutional: Positive for chills and fatigue. Negative for diaphoresis and fever.  HENT: Negative for hearing loss and tinnitus.   Eyes: Negative for visual disturbance.  Respiratory: Negative for cough and shortness of breath.   Cardiovascular: Negative for chest pain and palpitations.  Gastrointestinal: Positive for abdominal pain ( intermittent). Negative for blood in stool.  Neurological: Negative for tremors, weakness and headaches.    Patient Active Problem List   Diagnosis Date Noted  . Thalassemia 06/09/2015    Current Outpatient Prescriptions on File Prior to Visit  Medication Sig Dispense Refill  . lisinopril (PRINIVIL,ZESTRIL) 5 MG tablet Take 1 tablet (5 mg total) by mouth daily. (Patient not taking: Reported on 08/08/2016) 30 tablet 6   No current facility-administered medications on file prior to visit.     No Known Allergies     Social History   Social History  . Marital status: Married    Spouse name: N/A  . Number of children: N/A  . Years of education: N/A   Occupational History  . Not on file.   Social History Main  Topics  . Smoking status: Never Smoker  . Smokeless tobacco: Never Used  . Alcohol use 0.0 oz/week     Comment: Sometimes  . Drug use: No  . Sexual activity: Not on file   Other Topics Concern  . Not on file   Social History Narrative  . No narrative on file    Objective:  BP 140/90 (BP Location: Right Arm, Patient Position: Sitting, Cuff Size: Small)   Pulse 66   Temp 98.2 F (36.8 C) (Oral)   Resp 16   Ht 5\' 1"  (1.549 m)   Wt 146 lb 9.6 oz (66.5 kg)   SpO2 98%   BMI 27.70 kg/m   Physical Exam  Constitutional: He is oriented to person, place, and time.  Well developed, well nourished, lying on exam table unwilling to move due to fear of eliciting dizziness.   HENT:  Head: Normocephalic and atraumatic.  Right Ear: Tympanic membrane, external ear and ear canal normal.  Left Ear: External ear and ear canal normal. 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  Nose: Nose normal.  Mouth/Throat: Oropharynx is clear and moist and mucous membranes are normal.  Eyes: Conjunctivae are normal.  Neck: Normal range of motion.  Cardiovascular: Normal rate, regular rhythm and normal heart sounds.   Pulmonary/Chest: Effort normal.  Abdominal: Soft. Normal appearance and bowel sounds are normal. There is tenderness in the left upper quadrant.  Neurological: He is alert and oriented to person, place, and time. He has  normal strength and intact cranial nerves. He has a normal Cerebellar Exam and a normal Romberg Test. He shows no pronator drift. Gait abnormal.  Negative Dix-Hallpike.  Dizziness elicited when he sits up on exam table or stands.   Skin: Skin is warm and dry.  Psychiatric: Affect normal.  Vitals reviewed.  Orthostatic VS for the past 24 hrs:  BP- Lying Pulse- Lying BP- Sitting Pulse- Sitting BP- Standing at 0 minutes Pulse- Standing at 0 minutes  08/08/16 1159 146/88 67 (!) 154/95 58 (!) 147/93 71     Results for orders placed or performed in visit on 08/08/16  (from the past 24 hour(s))  POCT CBC     Status: Abnormal   Collection Time: 08/08/16 11:47 AM  Result Value Ref Range   WBC 10.0 4.6 - 10.2 K/uL   Lymph, poc 1.8 0.6 - 3.4   POC LYMPH PERCENT 18.3 10 - 50 %L   MID (cbc) 0.6 0 - 0.9   POC MID % 5.6 0 - 12 %M   POC Granulocyte 7.6 (A) 2 - 6.9   Granulocyte percent 76.1 37 - 80 %G   RBC 5.38 4.69 - 6.13 M/uL   Hemoglobin 12.6 (A) 14.1 - 18.1 g/dL   HCT, POC 16.138.1 (A) 09.643.5 - 53.7 %   MCV 70.8 (A) 80 - 97 fL   MCH, POC 23.5 (A) 27 - 31.2 pg   MCHC 33.2 31.8 - 35.4 g/dL   RDW, POC 04.513.1 %   Platelet Count, POC 248 142 - 424 K/uL   MPV 7.5 0 - 99.8 fL  POCT urinalysis dipstick     Status: Abnormal   Collection Time: 08/08/16 12:09 PM  Result Value Ref Range   Color, UA yellow yellow   Clarity, UA clear clear   Glucose, UA negative negative   Bilirubin, UA negative negative   Ketones, POC UA negative negative   Spec Grav, UA 1.025    Blood, UA moderate (A) negative   pH, UA 6.0    Protein Ur, POC trace (A) negative   Urobilinogen, UA 0.2    Nitrite, UA Negative Negative   Leukocytes, UA Negative Negative  POCT Microscopic Urinalysis ([facility])     Status: Abnormal   Collection Time: 08/08/16 12:21 PM  Result Value Ref Range   WBC,UR,HPF,POC Few (A) None WBC/hpf   RBC,UR,HPF,POC None None RBC/hpf   Bacteria Few (A) None, Too numerous to count   Mucus Present (A) Absent   Epithelial Cells, UR Per Microscopy Few (A) None, Too numerous to count cells/hpf   EKG shows normal sinus rhythm at 63 bpm with no acute ST or T wave changes. EKG findings presented to Dr. Neva SeatGreene.   Assessment and Plan :  This case was precepted with Dr. Neva SeatGreene.  1. Dizziness -Likely due to dehydration for vomiting, diarrhea, and no water intake. Instructed to drink at least 64 oz of water daily.  - POCT CBC - COMPLETE METABOLIC PANEL WITH GFR - TSH - Orthostatic vital signs - POCT urinalysis dipstick - POCT Microscopic Urinalysis ([facility]) - EKG 12-Lead -  dimenhyDRINATE (DRAMAMINE) 50 MG tablet; Take 1 tablet (50 mg total) by mouth every 8 (eight) hours as needed.  Dispense: 30 tablet; Refill: 0 -Return to clinic in 4 days or sooner if symptoms worsen  2. Nausea without vomiting - ondansetron (ZOFRAN ODT) 4 MG disintegrating tablet; Take 1 tablet (4 mg total) by mouth every 8 (eight) hours as needed for nausea or vomiting.  Dispense:  20 tablet; Refill: 0  3. Elevated blood pressure reading -Pt will follow up on Friday 12/01 for reevaluation of elevated bp  4. Gastroenteritis -Instructed to continue fluids and BRAT diet, advance diet as tolerated.   5. Left upper quadrant pain - Lipase   Benjiman Core PA-C  [facility] 08/08/2016 12:22 PM

## 2016-08-11 LAB — T3, FREE: T3, Free: 2.9 pg/mL (ref 2.3–4.2)

## 2016-08-11 LAB — T4, FREE: FREE T4: 1.1 ng/dL (ref 0.8–1.8)

## 2016-08-12 ENCOUNTER — Ambulatory Visit (INDEPENDENT_AMBULATORY_CARE_PROVIDER_SITE_OTHER): Payer: Managed Care, Other (non HMO) | Admitting: Physician Assistant

## 2016-08-12 VITALS — BP 144/82 | HR 75 | Temp 98.2°F | Resp 20 | Ht 61.0 in | Wt 149.2 lb

## 2016-08-12 DIAGNOSIS — K529 Noninfective gastroenteritis and colitis, unspecified: Secondary | ICD-10-CM | POA: Diagnosis not present

## 2016-08-12 DIAGNOSIS — Z1211 Encounter for screening for malignant neoplasm of colon: Secondary | ICD-10-CM

## 2016-08-12 DIAGNOSIS — R1032 Left lower quadrant pain: Secondary | ICD-10-CM | POA: Diagnosis not present

## 2016-08-12 LAB — POCT CBC
GRANULOCYTE PERCENT: 60.9 % (ref 37–80)
HEMATOCRIT: 37.2 % — AB (ref 43.5–53.7)
HEMOGLOBIN: 12.6 g/dL — AB (ref 14.1–18.1)
LYMPH, POC: 2.3 (ref 0.6–3.4)
MCH, POC: 23.2 pg — AB (ref 27–31.2)
MCHC: 33.8 g/dL (ref 31.8–35.4)
MCV: 68.7 fL — AB (ref 80–97)
MID (cbc): 0.6 (ref 0–0.9)
MPV: 7.9 fL (ref 0–99.8)
PLATELET COUNT, POC: 217 10*3/uL (ref 142–424)
POC GRANULOCYTE: 4.4 (ref 2–6.9)
POC LYMPH PERCENT: 31.3 %L (ref 10–50)
POC MID %: 7.8 %M (ref 0–12)
RBC: 5.41 M/uL (ref 4.69–6.13)
RDW, POC: 13 %
WBC: 7.3 10*3/uL (ref 4.6–10.2)

## 2016-08-12 NOTE — Patient Instructions (Addendum)
If you ever develop worsening abdominal pain with fever please consider following up and having a CT scan. I also highly recommend having a colonoscopy due to your age. Please let me know if you have any questions.   Thank you for letting me participate in your health and well being.   IF you received an x-ray today, you will receive an invoice from [facility]. Please contact [facility] at [phone] with questions or concerns regarding your invoice.   IF you received labwork today, you will receive an invoice from [facility]. Please contact [facility] at [phone] with questions or concerns regarding your invoice.   Our billing staff will not be able to assist you with questions regarding bills from these companies.  You will be contacted with the lab results as soon as they are available. The fastest way to get your results is to activate your My Chart account. Instructions are located on the last page of this paperwork. If you have not heard from us regarding the results in 2 weeks, please contact this office.

## 2016-08-12 NOTE — Progress Notes (Signed)
MRN: [id number] DOB: 09/18/1965  Subjective:   Starleen BlueUi Smaldone is a 50 y.o. male presenting for follow up on gastroenteritis. He was initially seen on 08/08/16 with dizziness and nausea post one day of diarrhea and vomiting. He appeared to be dehydrated and was encouraged to go home and drink lots of fluid and rest. He states he feels much better today. Dizziiness and nausea have compeltely resovled with drinking water over the past few days. Still having left sided abdominal pain but notes this pain has been there on and off for over ten years.   Alison StallingUi has a current medication list which includes the following prescription(s): dimenhydrinate, ondansetron, and lisinopril. Also has No Known Allergies.  Alison StallingUi  has a past medical history of Hypertension and Kidney stones. Also  has no past surgical history on file.   Objective:   Vitals: BP (!) 144/82 (BP Location: Right Arm, Patient Position: Sitting, Cuff Size: Small)   Pulse 75   Temp 98.2 F (36.8 C)   Resp 20   Ht 5\' 1"  (1.549 m)   Wt 149 lb 3.2 oz (67.7 kg)   SpO2 96%   BMI 28.19 kg/m   Physical Exam  Constitutional: He is oriented to person, place, and time. He appears well-developed and well-nourished.  HENT:  Head: Normocephalic and atraumatic.  Eyes: Conjunctivae are normal.  Neck: Normal range of motion.  Pulmonary/Chest: Effort normal.  Abdominal: Soft. Normal appearance and bowel sounds are normal. There is tenderness in the left upper quadrant and left lower quadrant. There is no rigidity.  Neurological: He is alert and oriented to person, place, and time.  Skin: Skin is warm and dry.  Psychiatric: He has a normal mood and affect.  Vitals reviewed.   Results for orders placed or performed in visit on 08/12/16 (from the past 24 hour(s))  POCT CBC     Status: Abnormal   Collection Time: 08/12/16  9:26 AM  Result Value Ref Range   WBC 7.3 4.6 - 10.2 K/uL   Lymph, poc 2.3 0.6 - 3.4   POC LYMPH PERCENT 31.3 10 - 50 %L   MID  (cbc) 0.6 0 - 0.9   POC MID % 7.8 0 - 12 %M   POC Granulocyte 4.4 2 - 6.9   Granulocyte percent 60.9 37 - 80 %G   RBC 5.41 4.69 - 6.13 M/uL   Hemoglobin 12.6 (A) 14.1 - 18.1 g/dL   HCT, POC 78.237.2 (A) 95.643.5 - 53.7 %   MCV 68.7 (A) 80 - 97 fL   MCH, POC 23.2 (A) 27 - 31.2 pg   MCHC 33.8 31.8 - 35.4 g/dL   RDW, POC 21.313.0 %   Platelet Count, POC 217 142 - 424 K/uL   MPV 7.9 0 - 99.8 fL    Assessment and Plan :  1. LLQ pain - Labs and vitals reassuring, pt instructed that if he continues to have this pain he should consider obtaining a CT scan of his abdomen. Pt refuses stating he has had this pain on and off for over ten years.  - POCT CBC  2. Screen for colon cancer -Pt refuses to have colonoscopy despite me thoroughly recommending that he obtain one. I instructed him that I will put in a referral for him and if he changes his mind he can go have it done.  - Ambulatory referral to Gastroenterology  3. Gastroenteritis - Resolved.  -Return to clinic if symptoms worsen or as needed  Benjiman CoreBrittany Jovante Hammitt, PA-C  [facility] 08/12/2016 1:18 PM

## 2016-09-30 ENCOUNTER — Encounter: Payer: Self-pay | Admitting: Physician Assistant

## 2018-10-15 ENCOUNTER — Encounter (HOSPITAL_COMMUNITY): Payer: Self-pay

## 2018-10-15 ENCOUNTER — Other Ambulatory Visit: Payer: Self-pay

## 2018-10-15 ENCOUNTER — Ambulatory Visit (HOSPITAL_COMMUNITY)
Admission: EM | Admit: 2018-10-15 | Discharge: 2018-10-15 | Disposition: A | Discharge status: Home / Self Care | Payer: Managed Care, Other (non HMO) | Attending: Family Medicine | Admitting: Family Medicine

## 2018-10-15 DIAGNOSIS — J209 Acute bronchitis, unspecified: Secondary | ICD-10-CM

## 2018-10-15 MED ORDER — DOXYCYCLINE HYCLATE 100 MG PO CAPS: 100.0000 mg | ORAL_CAPSULE | Freq: Two times a day (BID) | ORAL | 0 refills | Status: DC

## 2018-10-15 MED ORDER — BENZONATATE 100 MG PO CAPS: 100.0000 mg | ORAL_CAPSULE | Freq: Three times a day (TID) | ORAL | 0 refills | Status: DC

## 2018-10-15 NOTE — ED Triage Notes (Signed)
Pt cc cough , chills, headaches and stretchy throat. X 1 week.

## 2018-10-15 NOTE — ED Provider Notes (Signed)
[facility]    CSN: [id number] Arrival date & time: 10/15/18  1355     History   Chief Complaint Chief Complaint  Patient presents with  . Cough    HPI Daniel Dougherty is a 53 y.o. male.   Patient works in the cooler or freezer at [employer] had flulike symptoms last week and took over-the-counter medicines and had improved until he went back to the cool work today now he is coughing up dark sputum.  HPI  Past Medical History:  Diagnosis Date  . Hypertension   . Kidney stones     Patient Active Problem List   Diagnosis Date Noted  . Thalassemia 06/09/2015    History reviewed. No pertinent surgical history.     Home Medications    Prior to Admission medications   Medication Sig Start Date End Date Taking? Authorizing Provider  dimenhyDRINATE (DRAMAMINE) 50 MG tablet Take 1 tablet (50 mg total) by mouth every 8 (eight) hours as needed. 08/08/16   Benjiman Core D, PA-C  lisinopril (PRINIVIL,ZESTRIL) 5 MG tablet Take 1 tablet (5 mg total) by mouth daily. Patient not taking: Reported on 08/12/2016 06/05/15   Ofilia Neas, PA-C  ondansetron (ZOFRAN ODT) 4 MG disintegrating tablet Take 1 tablet (4 mg total) by mouth every 8 (eight) hours as needed for nausea or vomiting. 08/08/16   Magdalene River, PA-C    Family History History reviewed. No pertinent family history.  Social History Social History   Tobacco Use  . Smoking status: Never Smoker  . Smokeless tobacco: Never Used  Substance Use Topics  . Alcohol use: Yes    Alcohol/week: 0.0 standard drinks    Comment: Sometimes  . Drug use: No     Allergies   Patient has no known allergies.   Review of Systems Review of Systems  Constitutional: Positive for fever.  HENT: Positive for congestion.   Respiratory: Positive for cough.   Cardiovascular: Negative.      Physical Exam Triage Vital Signs ED Triage Vitals [10/15/18 1511]  Enc Vitals Group     BP (!) 157/80     Pulse Rate 80       Resp 18     Temp 98 F (36.7 C)     Temp Source Oral     SpO2 98 %     Weight 146 lb 6.4 oz (66.4 kg)     Height      Head Circumference      Peak Flow      Pain Score 5     Pain Loc      Pain Edu?      Excl. in GC?    No data found.  Updated Vital Signs BP (!) 157/80 (BP Location: Right Arm)   Pulse 80   Temp 98 F (36.7 C) (Oral)   Resp 18   Wt 66.4 kg   SpO2 98%   BMI 27.66 kg/m   Visual Acuity Right Eye Distance:   Left Eye Distance:   Bilateral Distance:    Right Eye Near:   Left Eye Near:    Bilateral Near:     Physical Exam Constitutional:      Appearance: Normal appearance. He is normal weight.  HENT:     Right Ear: Tympanic membrane normal.     Left Ear: Tympanic membrane normal.     Mouth/Throat:     Mouth: Mucous membranes are moist.     Pharynx: Oropharynx is clear.  Cardiovascular:     Rate and Rhythm: Normal rate and regular rhythm.     Heart sounds: Normal heart sounds.  Pulmonary:     Effort: Pulmonary effort is normal.     Breath sounds: Rhonchi and rales present.  Abdominal:     General: Bowel sounds are normal.  Neurological:     Mental Status: He is alert.      UC Treatments / Results  Labs (all labs ordered are listed, but only abnormal results are displayed) Labs Reviewed - No data to display  EKG None  Radiology No results found.  Procedures Procedures (including critical care time)  Medications Ordered in UC Medications - No data to display  Initial Impression / Assessment and Plan / UC Course  I have reviewed the triage vital signs and the nursing notes.  Pertinent labs & imaging results that were available during my care of the patient were reviewed by me and considered in my medical decision making (see chart for details).     Viral syndrome and flulike illness but now with bronchitis Final Clinical Impressions(s) / UC Diagnoses   Final diagnoses:  None   Discharge Instructions   None    ED  Prescriptions    None     Controlled Substance Prescriptions [place] Controlled Substance Registry consulted? No   Frederica Kuster, MD 10/15/18 1539

## 2020-12-18 ENCOUNTER — Other Ambulatory Visit: Payer: Self-pay

## 2020-12-18 ENCOUNTER — Ambulatory Visit (INDEPENDENT_AMBULATORY_CARE_PROVIDER_SITE_OTHER): Payer: Managed Care, Other (non HMO)

## 2020-12-18 ENCOUNTER — Encounter (HOSPITAL_COMMUNITY): Payer: Self-pay | Admitting: Emergency Medicine

## 2020-12-18 ENCOUNTER — Ambulatory Visit (HOSPITAL_COMMUNITY)
Admission: EM | Admit: 2020-12-18 | Discharge: 2020-12-18 | Disposition: A | Discharge status: Home / Self Care | Payer: Managed Care, Other (non HMO) | Attending: Student | Admitting: Student

## 2020-12-18 DIAGNOSIS — M79621 Pain in right upper arm: Secondary | ICD-10-CM

## 2020-12-18 DIAGNOSIS — W19XXXA Unspecified fall, initial encounter: Secondary | ICD-10-CM | POA: Diagnosis not present

## 2020-12-18 DIAGNOSIS — M898X2 Other specified disorders of bone, upper arm: Secondary | ICD-10-CM

## 2020-12-18 NOTE — ED Provider Notes (Signed)
[facility]    CSN: [id number] Arrival date & time: 12/18/20  1131      History   Chief Complaint Chief Complaint  Patient presents with  . Fall  . Shoulder Pain    Right    HPI Daniel Dougherty is a 55 y.o. male presenting with R arm pain following fall.  Medical history noncontributory.  States he tripped and fell, his right arm sustaining direct blow.  States since then he has had pain of his upper right arm, worse with movement.  Denies pain elsewhere, sensation changes, numbness/tingling.  Denies dizziness before or after fall.  Denies head trauma, loss of consciousness.  He is right-handed.  Active job and is requesting extended work note. Declines language line multiple times in favor of translation by family member.  HPI  Past Medical History:  Diagnosis Date  . Hypertension   . Kidney stones     Patient Active Problem List   Diagnosis Date Noted  . Thalassemia 06/09/2015    History reviewed. No pertinent surgical history.     Home Medications    Prior to Admission medications   Medication Sig Start Date End Date Taking? Authorizing Provider  benzonatate (TESSALON) 100 MG capsule Take 1 capsule (100 mg total) by mouth every 8 (eight) hours. 10/15/18   Frederica Kuster, MD  dimenhyDRINATE (DRAMAMINE) 50 MG tablet Take 1 tablet (50 mg total) by mouth every 8 (eight) hours as needed. 08/08/16   Benjiman Core D, PA-C  doxycycline (VIBRAMYCIN) 100 MG capsule Take 1 capsule (100 mg total) by mouth 2 (two) times daily. 10/15/18   Frederica Kuster, MD  lisinopril (PRINIVIL,ZESTRIL) 5 MG tablet Take 1 tablet (5 mg total) by mouth daily. Patient not taking: Reported on 08/12/2016 06/05/15   Ofilia Neas, PA-C  ondansetron (ZOFRAN ODT) 4 MG disintegrating tablet Take 1 tablet (4 mg total) by mouth every 8 (eight) hours as needed for nausea or vomiting. 08/08/16   Magdalene River, PA-C    Family History History reviewed. No pertinent family history.  Social  History Social History   Tobacco Use  . Smoking status: Never Smoker  . Smokeless tobacco: Never Used  Substance Use Topics  . Alcohol use: Yes    Alcohol/week: 0.0 standard drinks    Comment: Sometimes  . Drug use: No     Allergies   Patient has no known allergies.   Review of Systems Review of Systems  Musculoskeletal:       R arm pain  All other systems reviewed and are negative.    Physical Exam Triage Vital Signs ED Triage Vitals  Enc Vitals Group     BP 12/18/20 1227 (!) 154/90     Pulse Rate 12/18/20 1227 66     Resp 12/18/20 1227 16     Temp 12/18/20 1227 98.7 F (37.1 C)     Temp Source 12/18/20 1227 Oral     SpO2 12/18/20 1227 98 %     Weight --      Height --      Head Circumference --      Peak Flow --      Pain Score 12/18/20 1226 7     Pain Loc --      Pain Edu? --      Excl. in GC? --    No data found.  Updated Vital Signs BP (!) 154/90 (BP Location: Left Arm)   Pulse 66   Temp 98.7 F (  37.1 C) (Oral)   Resp 16   SpO2 98%   Visual Acuity Right Eye Distance:   Left Eye Distance:   Bilateral Distance:    Right Eye Near:   Left Eye Near:    Bilateral Near:     Physical Exam Vitals reviewed.  Constitutional:      General: He is not in acute distress.    Appearance: Normal appearance. He is not ill-appearing.  HENT:     Head: Normocephalic and atraumatic.  Eyes:     Extraocular Movements: Extraocular movements intact.     Pupils: Pupils are equal, round, and reactive to light.  Cardiovascular:     Rate and Rhythm: Normal rate and regular rhythm.     Heart sounds: Normal heart sounds.  Pulmonary:     Effort: Pulmonary effort is normal.     Breath sounds: Normal breath sounds and air entry.  Abdominal:     Tenderness: There is no abdominal tenderness. There is no right CVA tenderness, left CVA tenderness, guarding or rebound.     Comments: No bowel or bladder incontinence.  Musculoskeletal:     Cervical back: Normal range of  motion. No swelling, deformity, signs of trauma, rigidity, spasms, tenderness, bony tenderness or crepitus. No pain with movement.     Thoracic back: No swelling, deformity, signs of trauma, spasms, tenderness or bony tenderness. Normal range of motion. No scoliosis.     Lumbar back: No swelling, deformity, signs of trauma, spasms, tenderness or bony tenderness. Normal range of motion. Negative right straight leg raise test and negative left straight leg raise test. No scoliosis.     Comments: Strength 5/5 in UEs and LEs. TTP diffusely along R deltoid muscle. No ecchymosis or abrasion. Strength 5/5, sensation intact. Negative neer, empty beer can, crossbody adduction. Grip strength 5/5, radial pulse 2+.  Absolutely no other injury, deformity, tenderness.  Skin:    Capillary Refill: Capillary refill takes less than 2 seconds.  Neurological:     General: No focal deficit present.     Mental Status: He is alert.     Cranial Nerves: No cranial nerve deficit.     Comments: Strength 5/5 in UEs and LEs. Gait normal. Sensation intact in UEs and LEs.   Psychiatric:        Mood and Affect: Mood normal.        Behavior: Behavior normal.        Thought Content: Thought content normal.        Judgment: Judgment normal.      UC Treatments / Results  Labs (all labs ordered are listed, but only abnormal results are displayed) Labs Reviewed - No data to display  EKG   Radiology DG Humerus Right  Result Date: 12/18/2020 CLINICAL DATA:  Fall.  Arm pain EXAM: RIGHT HUMERUS - 2+ VIEW COMPARISON:  None. FINDINGS: There is no evidence of fracture or other focal bone lesions. Soft tissues are unremarkable. IMPRESSION: Negative. Electronically Signed   By: Marlan Palau M.D.   On: 12/18/2020 13:42    Procedures Procedures (including critical care time)  Medications Ordered in UC Medications - No data to display  Initial Impression / Assessment and Plan / UC Course  I have reviewed the triage vital  signs and the nursing notes.  Pertinent labs & imaging results that were available during my care of the patient were reviewed by me and considered in my medical decision making (see chart for details).     This  patient is a 55 year old male presenting with humeral pain following fall.  Benign exam, neurovascularly intact.  Xray right humerus- negative for bony abnormality. Films interpreted by myself and radiologist.   Sling provided. RICE, tylenol/ibuprofen.  ED return precautions provided.   Final Clinical Impressions(s) / UC Diagnoses   Final diagnoses:  Pain of right humerus     Discharge Instructions     -Keep your shoulder sling on while you are having pain for the next 1 to 2 weeks. -Do not do anything that causes pain.  This includes heavy lifting, etc.  If you try an activity at work and it hurts, stop the activity. -I can only provide a work note for 3 days.  This is our clinic policy.  If you need a note for longer, you can come back on Monday and we can write you another note for 3 days.  If you want to explore the possibility of going on to disability, follow-up with our occupational health people; information below.    ED Prescriptions    None     PDMP not reviewed this encounter.   Rhys Martini, PA-C 12/18/20 1431

## 2020-12-18 NOTE — Discharge Instructions (Signed)
-  Keep your shoulder sling on while you are having pain for the next 1 to 2 weeks. -Do not do anything that causes pain.  This includes heavy lifting, etc.  If you try an activity at work and it hurts, stop the activity. -I can only provide a work note for 3 days.  This is our clinic policy.  If you need a note for longer, you can come back on Monday and we can write you another note for 3 days.  If you want to explore the possibility of going on to disability, follow-up with our occupational health people; information below.

## 2020-12-18 NOTE — ED Triage Notes (Signed)
Pt presents with right shoulder pain after fall xs 2 days ago.

## 2020-12-25 ENCOUNTER — Ambulatory Visit (HOSPITAL_COMMUNITY)
Admission: EM | Admit: 2020-12-25 | Discharge: 2020-12-25 | Disposition: A | Discharge status: Home / Self Care | Payer: Managed Care, Other (non HMO) | Attending: Urgent Care | Admitting: Urgent Care

## 2020-12-25 ENCOUNTER — Encounter (HOSPITAL_COMMUNITY): Payer: Self-pay

## 2020-12-25 ENCOUNTER — Other Ambulatory Visit: Payer: Self-pay

## 2020-12-25 DIAGNOSIS — M25511 Pain in right shoulder: Secondary | ICD-10-CM | POA: Diagnosis not present

## 2020-12-25 MED ORDER — TIZANIDINE HCL 4 MG PO TABS: 4.0000 mg | ORAL_TABLET | Freq: Every day | ORAL | 0 refills | Status: DC

## 2020-12-25 MED ORDER — NAPROXEN 375 MG PO TABS: 375.0000 mg | ORAL_TABLET | Freq: Two times a day (BID) | ORAL | 0 refills | Status: DC

## 2020-12-25 NOTE — ED Triage Notes (Signed)
Pt presents for note to return to work after being out of work for shoulder pain a few days ago; pt states his shoulder feels much better only very little pain.

## 2020-12-25 NOTE — ED Provider Notes (Signed)
[facility]   MRN: [id number] DOB: [birth year]  Subjective:   Daniel Dougherty is a 55 y.o. male presenting for recheck on his right shoulder pain.  Patient was initially seen 12/18/2020.  He sustained a right shoulder injury from a fall.  X-rays were negative.  He has been using ibuprofen intermittently for his symptoms.  Reports that his pain is significantly improved.  Would like to return to work.  Denies bruising, swelling, numbness or tingling.  No further traumas.  No current facility-administered medications for this encounter.  Current Outpatient Medications:  .  benzonatate (TESSALON) 100 MG capsule, Take 1 capsule (100 mg total) by mouth every 8 (eight) hours., Disp: 21 capsule, Rfl: 0 .  dimenhyDRINATE (DRAMAMINE) 50 MG tablet, Take 1 tablet (50 mg total) by mouth every 8 (eight) hours as needed., Disp: 30 tablet, Rfl: 0 .  doxycycline (VIBRAMYCIN) 100 MG capsule, Take 1 capsule (100 mg total) by mouth 2 (two) times daily., Disp: 20 capsule, Rfl: 0 .  lisinopril (PRINIVIL,ZESTRIL) 5 MG tablet, Take 1 tablet (5 mg total) by mouth daily. (Patient not taking: Reported on 08/12/2016), Disp: 30 tablet, Rfl: 6 .  ondansetron (ZOFRAN ODT) 4 MG disintegrating tablet, Take 1 tablet (4 mg total) by mouth every 8 (eight) hours as needed for nausea or vomiting., Disp: 20 tablet, Rfl: 0   No Known Allergies  Past Medical History:  Diagnosis Date  . Hypertension   . Kidney stones      History reviewed. No pertinent surgical history.  History reviewed. No pertinent family history.  Social History   Tobacco Use  . Smoking status: Never Smoker  . Smokeless tobacco: Never Used  Substance Use Topics  . Alcohol use: Yes    Alcohol/week: 0.0 standard drinks    Comment: Sometimes  . Drug use: No    ROS   Objective:   Vitals: BP (!) 155/95 (BP Location: Right Arm)   Pulse 79   Temp 98.4 F (36.9 C) (Oral)   Resp 18   SpO2 96%   Physical Exam Constitutional:       General: He is not in acute distress.    Appearance: Normal appearance. He is well-developed and normal weight. He is not ill-appearing, toxic-appearing or diaphoretic.  HENT:     Head: Normocephalic and atraumatic.     Right Ear: External ear normal.     Left Ear: External ear normal.     Nose: Nose normal.     Mouth/Throat:     Pharynx: Oropharynx is clear.  Eyes:     General: No scleral icterus.       Right eye: No discharge.        Left eye: No discharge.     Extraocular Movements: Extraocular movements intact.     Pupils: Pupils are equal, round, and reactive to light.  Cardiovascular:     Rate and Rhythm: Normal rate.  Pulmonary:     Effort: Pulmonary effort is normal.  Musculoskeletal:     Right shoulder: No swelling, deformity, effusion, laceration, tenderness, bony tenderness or crepitus. Normal range of motion. Normal strength.     Cervical back: Normal range of motion.  Neurological:     Mental Status: He is alert and oriented to person, place, and time.  Psychiatric:        Mood and Affect: Mood normal.        Behavior: Behavior normal.        Thought Content: Thought content  normal.        Judgment: Judgment normal.     Assessment and Plan :   PDMP not reviewed this encounter.  1. Pain in joint of right shoulder     Patient is doing very well, physical exam findings are reassuring.  Recommended naproxen, tizanidine as needed as he returns back to work.  No restrictions necessary. Counseled patient on potential for adverse effects with medications prescribed/recommended today, ER and return-to-clinic precautions discussed, patient verbalized understanding.    Wallis Bamberg, PA-C 12/25/20 1238

## 2023-11-14 ENCOUNTER — Ambulatory Visit (HOSPITAL_COMMUNITY)
Admission: EM | Admit: 2023-11-14 | Discharge: 2023-11-14 | Disposition: A | Discharge status: Home / Self Care | Attending: Family Medicine | Admitting: Family Medicine

## 2023-11-14 ENCOUNTER — Encounter (HOSPITAL_COMMUNITY): Payer: Self-pay

## 2023-11-14 DIAGNOSIS — M25511 Pain in right shoulder: Secondary | ICD-10-CM | POA: Diagnosis not present

## 2023-11-14 DIAGNOSIS — I10 Essential (primary) hypertension: Secondary | ICD-10-CM

## 2023-11-14 MED ORDER — AMLODIPINE BESYLATE 5 MG PO TABS: 5.0000 mg | ORAL_TABLET | Freq: Every day | ORAL | 1 refills | Status: DC

## 2023-11-14 NOTE — ED Provider Notes (Addendum)
  [facility]   [id number] 11/14/23 Arrival Time: 1217  ASSESSMENT & PLAN:  1. Right shoulder pain, unspecified chronicity   2. Elevated blood pressure reading in office with diagnosis of hypertension    Requests to begin anti-HTN medication. BP is always elevated when he has been here. Has new PCP appt 12/2023. Begin: Meds ordered this encounter  Medications   amLODipine (NORVASC) 5 MG tablet    Sig: Take 1 tablet (5 mg total) by mouth daily.    Dispense:  30 tablet    Refill:  1   Work note provided.   Reviewed expectations re: course of current medical issues. Questions answered. Outlined signs and symptoms indicating need for more acute intervention. Patient verbalized understanding. After Visit Summary given.   SUBJECTIVE:  Daniel Dougherty is a 58 y.o. male who is here today requesting a note to return to work. Patient was out of work due to shoulder pain but he is no linger having pain. He was out of work since 10/26/2023. Daughter is with him; requests that he start anti-HTN medication; long standing mildly elevated BP. No symptoms. Has new PCP in 12/2023. Has been on Lisinopril in the past but stopped; reports it gives him a HA.  Social History   Tobacco Use  Smoking Status Never  Smokeless Tobacco Never    OBJECTIVE:  Vitals:   11/14/23 1406  BP: (!) 161/95  Pulse: 78  Resp: 16  Temp: 98.1 F (36.7 C)  TempSrc: Oral  SpO2: 95%    General appearance: alert; no distress Lungs: clear to auscultation bilaterally Heart: regular Abdomen: soft, non-tender; bowel sounds normal Extremities: no edema; symmetrical with no gross deformities; R shoulder exam normal Skin: warm and dry Psychological: alert and cooperative; normal mood and affect  ECG: Orders placed or performed in visit on 08/08/16   EKG 12-Lead    Labs:  Labs Reviewed - No data to display  Imaging: No results found.  Allergies  Allergen Reactions   Lisinopril     Reports headaches  with Lisinopril per daughter.    Past Medical History:  Diagnosis Date   Hypertension    Kidney stones    Social History   Socioeconomic History   Marital status: Married    Spouse name: Not on file   Number of children: Not on file   Years of education: Not on file   Highest education level: Not on file  Occupational History   Not on file  Tobacco Use   Smoking status: Never   Smokeless tobacco: Never  Vaping Use   Vaping status: Never Used  Substance and Sexual Activity   Alcohol use: Not Currently    Comment: Sometimes   Drug use: No   Sexual activity: Not on file  Other Topics Concern   Not on file  Social History Narrative   Not on file   Social Drivers of Health   Financial Resource Strain: Not on file  Food Insecurity: Not on file  Transportation Needs: Not on file  Physical Activity: Not on file  Stress: Not on file  Social Connections: Not on file  Intimate Partner Violence: Not on file   History reviewed. No pertinent family history. History reviewed. No pertinent surgical history.     Mardella Layman, MD 11/14/23 1432    Mardella Layman, MD 11/14/23 [phone]

## 2023-11-14 NOTE — ED Triage Notes (Signed)
 Patient here today requesting a note to return to work. Patient was out of work due to shoulder pain but he is no linger having pain. He was out of work since 10/26/2023

## 2023-11-17 ENCOUNTER — Ambulatory Visit: Admitting: Urgent Care

## 2023-11-17 ENCOUNTER — Encounter: Payer: Self-pay | Admitting: Urgent Care

## 2023-11-17 VITALS — BP 168/103 | HR 76 | Ht 62.0 in | Wt 168.8 lb

## 2023-11-17 DIAGNOSIS — R946 Abnormal results of thyroid function studies: Secondary | ICD-10-CM

## 2023-11-17 DIAGNOSIS — D569 Thalassemia, unspecified: Secondary | ICD-10-CM

## 2023-11-17 DIAGNOSIS — I10 Essential (primary) hypertension: Secondary | ICD-10-CM

## 2023-11-17 DIAGNOSIS — D649 Anemia, unspecified: Secondary | ICD-10-CM

## 2023-11-17 DIAGNOSIS — R319 Hematuria, unspecified: Secondary | ICD-10-CM

## 2023-11-17 DIAGNOSIS — M7521 Bicipital tendinitis, right shoulder: Secondary | ICD-10-CM

## 2023-11-17 LAB — COMPREHENSIVE METABOLIC PANEL
ALT: 45 U/L (ref 0–53)
AST: 20 U/L (ref 0–37)
Albumin: 4.2 g/dL (ref 3.5–5.2)
Alkaline Phosphatase: 70 U/L (ref 39–117)
BUN: 18 mg/dL (ref 6–23)
CO2: 31 meq/L (ref 19–32)
Calcium: 9.4 mg/dL (ref 8.4–10.5)
Chloride: 101 meq/L (ref 96–112)
Creatinine, Ser: 0.81 mg/dL (ref 0.40–1.50)
GFR: 97.41 mL/min (ref >60.00)
Glucose, Bld: 80 mg/dL (ref 70–99)
Potassium: 3.2 meq/L — ABNORMAL LOW (ref 3.5–5.1)
Sodium: 141 meq/L (ref 135–145)
Total Bilirubin: 0.7 mg/dL (ref 0.2–1.2)
Total Protein: 7.6 g/dL (ref 6.0–8.3)

## 2023-11-17 LAB — CBC WITH DIFFERENTIAL/PLATELET
Basophils Absolute: 0 10*3/uL (ref 0.0–0.1)
Basophils Relative: 0.4 % (ref 0.0–3.0)
Eosinophils Absolute: 0.2 10*3/uL (ref 0.0–0.7)
Eosinophils Relative: 1.9 % (ref 0.0–5.0)
HCT: 40 % (ref 39.0–52.0)
Hemoglobin: 12.3 g/dL — ABNORMAL LOW (ref 13.0–17.0)
Lymphocytes Relative: 42.9 % (ref 12.0–46.0)
Lymphs Abs: 3.8 10*3/uL (ref 0.7–4.0)
MCHC: 30.8 g/dL (ref 30.0–36.0)
MCV: 74.6 fl — ABNORMAL LOW (ref 78.0–100.0)
Monocytes Absolute: 0.7 10*3/uL (ref 0.1–1.0)
Monocytes Relative: 8.4 % (ref 3.0–12.0)
Neutro Abs: 4.1 10*3/uL (ref 1.4–7.7)
Neutrophils Relative %: 46.4 % (ref 43.0–77.0)
Platelets: 257 10*3/uL (ref 150.0–400.0)
RBC: 5.36 Mil/uL (ref 4.22–5.81)
RDW: 13.5 % (ref 11.5–15.5)
WBC: 8.8 10*3/uL (ref 4.0–10.5)

## 2023-11-17 LAB — POCT URINALYSIS DIPSTICK
Bilirubin, UA: NEGATIVE
Blood, UA: POSITIVE
Glucose, UA: NEGATIVE
Ketones, UA: NEGATIVE
Leukocytes, UA: NEGATIVE
Nitrite, UA: NEGATIVE
Protein, UA: NEGATIVE
Spec Grav, UA: 1.02 (ref 1.010–1.025)
Urobilinogen, UA: 0.2 U/dL
pH, UA: 6 (ref 5.0–8.0)

## 2023-11-17 LAB — B12 AND FOLATE PANEL
Folate: 4.7 ng/mL — ABNORMAL LOW (ref >5.9)
Vitamin B-12: 320 pg/mL (ref 211–911)

## 2023-11-17 LAB — T3, FREE: T3, Free: 3.9 pg/mL (ref 2.3–4.2)

## 2023-11-17 LAB — T4, FREE: Free T4: 0.95 ng/dL (ref 0.60–1.60)

## 2023-11-17 LAB — TSH: TSH: 1.39 u[IU]/mL (ref 0.35–5.50)

## 2023-11-17 LAB — HEMOGLOBIN A1C: Hgb A1c MFr Bld: 5.9 % (ref 4.6–6.5)

## 2023-11-17 NOTE — Progress Notes (Signed)
 New Patient Office Visit  Subjective:  Patient ID: Daniel Dougherty, male    DOB: [birth year]  Age: 58 y.o. MRN: [id number]  CC:  Chief Complaint  Patient presents with   Establish Care    New pt est care. He has had elevated BP a few times. He was seen at urgent care a few weeks ago and was prescribed Amlodipine. He has a form that needs to be completed in order to return to work.    HPI Daniel Dougherty presents to establish care.   The patient is a 58 year old with hypertension who presents for a follow up of his elevated blood pressure. He is accompanied by his daughter.  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 The pain was located in the coracoid process of the R shoulder. Initially, he was seen by a company-referred doctor, but his worker's compensation claim was denied, and he was told he could no longer see the doctor. He was seen at [facility] and placed out of work. The shoulder pain has since improved significantly, and he wishes to return to work without restrictions.  He has a history of elevated blood pressure, first noted several years ago, with readings around 140 mmHg, which he considers normal for him. He was previously prescribed medication (lisinopril) but experienced dizziness and headaches, leading him to stop taking it. He is currently on amlodipine and reports no side effects, but he does not regularly monitor his blood pressure at home. No current headaches while on amlodipine. He did not take the medication today. He just started this medication on 10/26/23 after elevated readings noted while at the UC.  In 2017, blood work indicated a potentially overactive thyroid, but this has not been re-evaluated since. He has a history of anemia, likely attributed to thalassemia, and there was a past finding of blood in his urine  approximately seven to eight years ago. He does not take any over-the-counter supplements regularly, but uses acetaminophen for shoulder pain or headaches.  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 He works in a cold environment, such as a cooler or freezer, without issues. He has not eaten today and has only consumed two cups of tea.       Outpatient Encounter Medications as of 11/17/2023  Medication Sig   amLODipine (NORVASC) 5 MG tablet Take 1 tablet (5 mg total) by mouth daily.   No facility-administered encounter medications on file as of 11/17/2023.    Past Medical History:  Diagnosis Date   Hypertension    Kidney stones     History reviewed. No pertinent surgical history.  History reviewed. No pertinent family history.  Social History   Socioeconomic History   Marital status: Married    Spouse name: Not on file   Number of children: Not on file   Years of education: Not on file   Highest education level: Not on file  Occupational History   Not on file  Tobacco Use   Smoking status: Never   Smokeless tobacco: Never  Vaping Use   Vaping status: Never Used  Substance and Sexual Activity   Alcohol use: Not Currently    Comment: Sometimes   Drug use: No   Sexual activity: Not on file  Other Topics Concern   Not on file  Social History Narrative   Not on file   Social Drivers of Health   Financial Resource Strain: Not on file  Food Insecurity: Not on file  Transportation Needs: Not on file  Physical Activity: Not on file  Stress: Not on file  Social Connections: Not on file  Intimate Partner Violence: Not on file    ROS: as noted in HPI  Objective:  BP (!) 168/103   Pulse 76   Ht 5\' 2"  (1.575 m)   Wt 168 lb 12.8 oz (76.6 kg)   SpO2 95%   BMI 30.87 kg/m   Physical Exam Vitals and nursing note reviewed. Exam conducted with a chaperone present.  Constitutional:      General: He is not in  acute distress.    Appearance: Normal appearance. He is not ill-appearing, toxic-appearing or diaphoretic.  HENT:     Head: Normocephalic and atraumatic.     Right Ear: Tympanic membrane, ear canal and external ear normal. There is no impacted cerumen.     Left Ear: Tympanic membrane, ear canal and external ear normal. There is no impacted cerumen.     Nose: Nose normal.     Mouth/Throat:     Mouth: Mucous membranes are moist.     Pharynx: Oropharynx is clear. No oropharyngeal exudate or posterior oropharyngeal erythema.  Eyes:     General: No scleral icterus.       Right eye: No discharge.        Left eye: No discharge.     Extraocular Movements: Extraocular movements intact.     Pupils: Pupils are equal, round, and reactive to light.  Cardiovascular:     Rate and Rhythm: Normal rate and regular rhythm.     Pulses: Normal pulses.     Heart sounds: Normal heart sounds. No murmur heard.    No friction rub. No gallop.  Pulmonary:     Effort: Pulmonary effort is normal. No respiratory distress.     Breath sounds: Normal breath sounds. No stridor. No wheezing or rhonchi.  Musculoskeletal:     Cervical back: Normal range of motion and neck supple. No rigidity.     Right lower leg: No edema.     Left lower leg: No edema.  Lymphadenopathy:     Cervical: No cervical adenopathy.  Skin:    General: Skin is warm and dry.     Findings: No erythema or rash.  Neurological:     General: No focal deficit present.     Mental Status: He is alert and oriented to person, place, and time.     Motor: No weakness.     Gait: Gait normal.    Lab Results  Component Value Date   COLORU yellow 11/17/2023   CLARITYU clear 11/17/2023   GLUCOSEUR Negative 11/17/2023   BILIRUBINUR negative 11/17/2023   KETONESU negative 11/17/2023   SPECGRAV 1.020 11/17/2023   RBCUR positive 11/17/2023   PHUR 6.0 11/17/2023   PROTEINUR Negative 11/17/2023   UROBILINOGEN 0.2 11/17/2023   LEUKOCYTESUR Negative  11/17/2023    Last CBC Lab Results  Component Value Date   WBC 7.3 08/12/2016   HGB 12.6 (A) 08/12/2016   HCT 37.2 (A) 08/12/2016   MCV 68.7 (A) 08/12/2016   MCH 23.2 (A) 08/12/2016   RDW 14.5 05/15/2015   PLT 299 05/15/2015   Last metabolic panel Lab Results  Component Value Date   GLUCOSE 87 08/08/2016   NA 141 08/08/2016   K 4.3 08/08/2016  CL 104 08/08/2016   CO2 28 08/08/2016   BUN 14 08/08/2016   CREATININE 0.85 08/08/2016   GFRNONAA >89 08/08/2016   CALCIUM 9.3 08/08/2016   PROT 7.6 08/08/2016   ALBUMIN 4.5 08/08/2016   BILITOT 1.1 08/08/2016   ALKPHOS 60 08/08/2016   AST 19 08/08/2016   ALT 18 08/08/2016   Last lipids Lab Results  Component Value Date   CHOL 160 07/25/2014   HDL 40 07/25/2014   LDLCALC 85 07/25/2014   TRIG 174 (H) 07/25/2014   CHOLHDL 4.0 07/25/2014   Last hemoglobin A1c Lab Results  Component Value Date   HGBA1C 4.5 05/15/2015   Last thyroid functions Lab Results  Component Value Date   TSH 0.22 (L) 08/08/2016      Assessment & Plan:  Abnormal thyroid function test -     Thyroid stimulating immunoglobulin -     TSH -     T3, free -     T4, free -     Hemoglobin A1c  Anemia, unspecified type -     CBC with Differential/Platelet -     Comprehensive metabolic panel -     Hgb Fractionation Cascade -     Iron, TIBC and Ferritin Panel -     B12 and Folate Panel  Hematuria, unspecified type -     POCT urinalysis dipstick -     CBC with Differential/Platelet -     Comprehensive metabolic panel -     Iron, TIBC and Ferritin Panel  Thalassemia, unspecified type -     CBC with Differential/Platelet -     Comprehensive metabolic panel -     Hgb Fractionation Cascade  Essential hypertension -     POCT urinalysis dipstick -     CBC with Differential/Platelet -     Comprehensive metabolic panel -     Hemoglobin A1c  Biceps tendonitis on right  Assessment and Plan    Hypertension Elevated blood pressure readings at  urgent care and inconsistent medication use. Patient has been on Lisinopril in the past, but has stopped due to side effects. Currently on Amlodipine with unknown efficacy; did not take his pills yet today. -Continue Amlodipine and monitor blood pressure at home. -Check blood pressure 1 hour after taking Amlodipine. -Return for follow-up appointment with home blood pressure readings.  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 Pain has improved but could recur with continued repetitive motion. -Continue rest and over-the-counter anti-inflammatories as needed. -Return to work with no restrictions.  Hyperthyroidism Previous lab work showed possible hyperthyroidism, which could contribute to hypertension. -Check thyroid function tests.  Hematuria Previous lab work showed microscopic hematuria. -Check urinalysis today - still noted to have and will require workup.  Anemia Previous diagnosis of Thalassemia, known to cause anemia. -Check complete blood count.  General Health Maintenance -Encourage use of MyChart for communication and lab result review.        No follow-ups on file.  Pt has appointment scheduled with new PCP at [facility] next month.  Maretta Bees, PA

## 2023-11-17 NOTE — Patient Instructions (Addendum)
 Please take your blood pressure medication every morning. Do not skip doses. Purchase a blood pressure cuff and take his BP readings daily, 1 hour after taking his medication. Write them down, and bring the BP log and the bp cuff to his next appointment. Do not eat more than 3 grams of salt (sodium) daily as this can contribute to high blood pressure.  We drew labs today to further assess his condition. Please check Mychart often for the lab results.  Keep the follow up scheduled on 01/05/24 at [facility].

## 2023-11-18 LAB — IRON,TIBC AND FERRITIN PANEL
%SAT: 40 % (ref 20–48)
Ferritin: 610 ng/mL — ABNORMAL HIGH (ref 38–380)
Iron: 133 ug/dL (ref 50–180)
TIBC: 336 ug/dL (ref 250–425)

## 2023-11-20 LAB — HGB FRACTIONATION CASCADE: Hgb A2: 2 % (ref 1.8–3.2)

## 2023-11-20 LAB — HGB FRACTIONATION BY HPLC
Hgb A: 97.1 % (ref 96.4–98.8)
Hgb C: 0 %
Hgb E: 0 %
Hgb F: 0 % (ref 0.0–2.0)
Hgb S: 0 %
Hgb Variant: 0.9 % — ABNORMAL HIGH

## 2023-11-21 ENCOUNTER — Encounter: Payer: Self-pay | Admitting: Urgent Care

## 2023-11-21 ENCOUNTER — Other Ambulatory Visit: Payer: Self-pay | Admitting: Urgent Care

## 2023-11-21 DIAGNOSIS — R7303 Prediabetes: Secondary | ICD-10-CM

## 2023-11-21 DIAGNOSIS — D563 Thalassemia minor: Secondary | ICD-10-CM | POA: Insufficient documentation

## 2023-11-21 DIAGNOSIS — E538 Deficiency of other specified B group vitamins: Secondary | ICD-10-CM | POA: Insufficient documentation

## 2023-11-21 MED ORDER — FOLIC ACID 1 MG PO TABS: 1.0000 mg | ORAL_TABLET | Freq: Every day | ORAL | 0 refills | Status: DC

## 2024-01-05 ENCOUNTER — Ambulatory Visit: Payer: Managed Care, Other (non HMO) | Admitting: Family Medicine

## 2024-01-05 ENCOUNTER — Encounter: Payer: Self-pay | Admitting: Family Medicine

## 2024-01-05 ENCOUNTER — Ambulatory Visit: Admitting: Family Medicine

## 2024-01-05 VITALS — BP 142/90 | HR 60 | Temp 98.4°F | Ht 62.0 in | Wt 170.8 lb

## 2024-01-05 DIAGNOSIS — D569 Thalassemia, unspecified: Secondary | ICD-10-CM

## 2024-01-05 DIAGNOSIS — Z136 Encounter for screening for cardiovascular disorders: Secondary | ICD-10-CM

## 2024-01-05 DIAGNOSIS — E538 Deficiency of other specified B group vitamins: Secondary | ICD-10-CM

## 2024-01-05 DIAGNOSIS — I10 Essential (primary) hypertension: Secondary | ICD-10-CM

## 2024-01-05 DIAGNOSIS — D563 Thalassemia minor: Secondary | ICD-10-CM | POA: Diagnosis not present

## 2024-01-05 DIAGNOSIS — M25511 Pain in right shoulder: Secondary | ICD-10-CM

## 2024-01-05 DIAGNOSIS — R7303 Prediabetes: Secondary | ICD-10-CM

## 2024-01-05 DIAGNOSIS — G8929 Other chronic pain: Secondary | ICD-10-CM

## 2024-01-05 LAB — CBC WITH DIFFERENTIAL/PLATELET
Basophils Absolute: 0 10*3/uL (ref 0.0–0.1)
Basophils Relative: 0.4 % (ref 0.0–3.0)
Eosinophils Absolute: 0.1 10*3/uL (ref 0.0–0.7)
Eosinophils Relative: 0.6 % (ref 0.0–5.0)
HCT: 39.7 % (ref 39.0–52.0)
Hemoglobin: 12 g/dL — ABNORMAL LOW (ref 13.0–17.0)
Lymphocytes Relative: 25.5 % (ref 12.0–46.0)
Lymphs Abs: 2.6 10*3/uL (ref 0.7–4.0)
MCHC: 30.3 g/dL (ref 30.0–36.0)
MCV: 75.4 fl — ABNORMAL LOW (ref 78.0–100.0)
Monocytes Absolute: 0.7 10*3/uL (ref 0.1–1.0)
Monocytes Relative: 6.7 % (ref 3.0–12.0)
Neutro Abs: 6.8 10*3/uL (ref 1.4–7.7)
Neutrophils Relative %: 66.8 % (ref 43.0–77.0)
Platelets: 313 10*3/uL (ref 150.0–400.0)
RBC: 5.26 Mil/uL (ref 4.22–5.81)
RDW: 14 % (ref 11.5–15.5)
WBC: 10.2 10*3/uL (ref 4.0–10.5)

## 2024-01-05 LAB — LIPID PANEL
Cholesterol: 193 mg/dL (ref 0–200)
HDL: 55.8 mg/dL (ref >39.00)
LDL Cholesterol: 110 mg/dL — ABNORMAL HIGH (ref 0–99)
NonHDL: 136.85
Total CHOL/HDL Ratio: 3
Triglycerides: 136 mg/dL (ref 0.0–149.0)
VLDL: 27.2 mg/dL (ref 0.0–40.0)

## 2024-01-05 LAB — COMPREHENSIVE METABOLIC PANEL WITH GFR
ALT: 30 U/L (ref 0–53)
AST: 18 U/L (ref 0–37)
Albumin: 4.3 g/dL (ref 3.5–5.2)
Alkaline Phosphatase: 67 U/L (ref 39–117)
BUN: 15 mg/dL (ref 6–23)
CO2: 31 meq/L (ref 19–32)
Calcium: 9.5 mg/dL (ref 8.4–10.5)
Chloride: 102 meq/L (ref 96–112)
Creatinine, Ser: 0.89 mg/dL (ref 0.40–1.50)
GFR: 94.59 mL/min (ref >60.00)
Glucose, Bld: 98 mg/dL (ref 70–99)
Potassium: 4.1 meq/L (ref 3.5–5.1)
Sodium: 138 meq/L (ref 135–145)
Total Bilirubin: 0.8 mg/dL (ref 0.2–1.2)
Total Protein: 8 g/dL (ref 6.0–8.3)

## 2024-01-05 LAB — HEMOGLOBIN A1C: Hgb A1c MFr Bld: 5.4 % (ref 4.6–6.5)

## 2024-01-05 MED ORDER — FOLIC ACID 1 MG PO TABS: 1.0000 mg | ORAL_TABLET | Freq: Every day | ORAL | 1 refills | Status: DC

## 2024-01-05 MED ORDER — AMLODIPINE BESYLATE 5 MG PO TABS: 5.0000 mg | ORAL_TABLET | Freq: Every day | ORAL | 1 refills | Status: DC

## 2024-01-05 NOTE — Patient Instructions (Addendum)
 Welcome to [facility]!  Thank you for choosing us  for your Primary Care needs.   We offer in person and video appointments for your convenience. You may call our office to schedule appointments, or you may schedule appointments with me through MyChart.   The best way to get in contact with me is via MyChart message. This will get to me faster than a phone call, unless there is an emergency, then please call 911.  The lab is located downstairs in the Sports Medicine building, we also have xray available there.   We are checking labs today, will be in contact with any results that require further attention.  Continue with herbal medication for right shoulder pain.  Follow up with me in about 3 months for medication management.

## 2024-01-05 NOTE — Progress Notes (Signed)
 New Patient Office Visit  Subjective    Patient ID: Daniel Dougherty, male    DOB: 30-Mar-1966  Age: 58 y.o. MRN: [id number]  CC:  Chief Complaint  Patient presents with   Establish Care    HPI Daniel Dougherty presents to establish care today. He is accompanied by his daughter who is acting as Equities trader.  Declines video interpreter today. Reports compliance with medication regimen.  Requesting refill of amlodipine  and folic acid  today. He does not check his blood pressures at home. Denies any headaches, vision changes, chest pain, palpitations, swelling or pain in the lower extremities, other hyper or hypotensive symptoms.  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 Daughter states that he was being seen via work comp with his employer, but that was eventually denied and the process has since stopped. Is treating with herbal remedies at home with benefit. Declines physical therapy referral today. Denies other concerns today.  Outpatient Encounter Medications as of 01/05/2024  Medication Sig   [DISCONTINUED] amLODipine  (NORVASC ) 5 MG tablet Take 1 tablet (5 mg total) by mouth daily.   [DISCONTINUED] folic acid  (FOLVITE ) 1 MG tablet Take 1 tablet (1 mg total) by mouth daily.   amLODipine  (NORVASC ) 5 MG tablet Take 1 tablet (5 mg total) by mouth daily.   folic acid  (FOLVITE ) 1 MG tablet Take 1 tablet (1 mg total) by mouth daily.   No facility-administered encounter medications on file as of 01/05/2024.    Past Medical History:  Diagnosis Date   Hypertension    Kidney stones     History reviewed. No pertinent surgical history.  History reviewed. No pertinent family history.  Social History   Socioeconomic History   Marital status: Married    Spouse name: Not on file   Number of children: Not on file   Years of education: Not on file   Highest education level: Not on file  Occupational History   Not on file   Tobacco Use   Smoking status: Never   Smokeless tobacco: Never  Vaping Use   Vaping status: Never Used  Substance and Sexual Activity   Alcohol use: Not Currently    Comment: Sometimes   Drug use: No   Sexual activity: Not on file  Other Topics Concern   Not on file  Social History Narrative   Not on file   Social Drivers of Health   Financial Resource Strain: Not on file  Food Insecurity: Not on file  Transportation Needs: Not on file  Physical Activity: Not on file  Stress: Not on file  Social Connections: Not on file  Intimate Partner Violence: Not on file    ROS Per HPI      Objective    BP (!) 142/90   Pulse 60   Temp 98.4 F (36.9 C) (Temporal)   Ht 5\' 2"  (1.575 m)   Wt 170 lb 12.8 oz (77.5 kg)   SpO2 97%   BMI 31.24 kg/m   Physical Exam Vitals and nursing note reviewed.  Constitutional:      General: He is not in acute distress.    Appearance: Normal appearance.  HENT:     Head: Normocephalic and atraumatic.     Right Ear: External ear normal.     Left Ear: External ear normal.     Nose: Nose normal.     Mouth/Throat:     Mouth: Mucous membranes are moist.  Pharynx: Oropharynx is clear.  Eyes:     Extraocular Movements: Extraocular movements intact.  Cardiovascular:     Rate and Rhythm: Normal rate and regular rhythm.     Pulses: Normal pulses.     Heart sounds: Normal heart sounds.  Pulmonary:     Effort: Pulmonary effort is normal. No respiratory distress.     Breath sounds: Normal breath sounds. No wheezing, rhonchi or rales.  Musculoskeletal:        General: Normal range of motion.     Cervical back: Normal range of motion.     Right lower leg: No edema.     Left lower leg: No edema.  Lymphadenopathy:     Cervical: No cervical adenopathy.  Skin:    General: Skin is warm and dry.  Neurological:     General: No focal deficit present.     Mental Status: He is alert and oriented to person, place, and time.  Psychiatric:         Mood and Affect: Mood normal.        Behavior: Behavior normal.        Assessment & Plan:   Primary hypertension -     amLODIPine  Besylate; Take 1 tablet (5 mg total) by mouth daily.  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  Dispense: 90 tablet; Refill: 1  Thalassemia, unspecified type -     CBC with Differential/Platelet  Pre-diabetes -     Hemoglobin A1c  Chronic right shoulder pain -     Ambulatory referral to Sports Medicine  Encounter for screening for cardiovascular disorders -     Lipid panel     Return in about 3 months (around 04/05/2024) for meds.   Wellington Half, FNP

## 2024-01-11 ENCOUNTER — Telehealth: Payer: Self-pay | Admitting: Family Medicine

## 2024-01-11 NOTE — Telephone Encounter (Signed)
 Copied from CRM [id number]. Topic: Clinical - Lab/Test Results >> Jan 11, 2024 10:58 AM [place] A wrote: Reason for CRM:   Patient's daughter was returning call from Nurse Christian Cowden regarding lab results. Agent did not see daughter's name listed on active DPR in the side bar initially. Agent called [facility] spoke with Cleveland Dales and was informed that [facility] was scanned and signed 08-08-2016. Per Cleveland Dales nurse was unavailable clinic will call back.

## 2024-01-11 NOTE — Telephone Encounter (Signed)
 Patients daughter is on his DPR from 2017.Aaron Aas Spoke with patients daughter regarding patients lab results, gave a verbal understanding.

## 2024-02-08 ENCOUNTER — Ambulatory Visit: Payer: Self-pay

## 2024-02-08 NOTE — Telephone Encounter (Signed)
 Spoke with Vickie in office, she recommended patient go to UC. Called patients daughter, gave suggestion and she states patient is at work, when he gargles with salt water he gets some relief. She would rather bring him in tomorrow.

## 2024-02-08 NOTE — Telephone Encounter (Addendum)
  Chief Complaint: mouth pain Symptoms: gum/bottom teeth Frequency: x 1 week Pertinent Negatives: Patient denies fevers, ulcers, SOB Disposition: [] ED /[] Urgent Care (no appt availability in office) / [x] Appointment(In office/virtual)/ []  [facility]/ [] Home Care/ [] Refused Recommended Disposition /[] [facility]/ []  Follow-up with PCP Additional Notes: Pt daughter Veverly Grace, calling on pt behalf reporting lower gum/teeth pain x 1 week. Lek reports pt was previously having mouth pain, but then was exacerbated by weed whacking, where something potentially hit mouth. Now pt is c/o swelling and pain that is impacting chewing solids. Lek reports has been using OTC medications without relief. Scheduled patient on 02/09/2024. Caregiver verbalized understanding and to call back with worsening symptoms.     Copied from CRM [id number]. Topic: Clinical - Red Word Triage >> Feb 08, 2024  8:56 AM Howard Macho wrote: Reason for EAV:[id number] daughter called stating the patient gums are swelling and he is having teeth pain Reason for Disposition  [1] SEVERE mouth pain (e.g., excruciating) AND [2] not improved after 2 hours of pain medicine  Answer Assessment - Initial Assessment Questions 1. ONSET: "When did the mouth start hurting?" (e.g., hours or days ago)      Gum pain/toothache on the bottom - started around Friday while weed wacking - daughter reports exacerbated sx Has tried OTC meds/gargling with warm salt water with no/minimal relief Daughter Lek 2. SEVERITY: "How bad is the pain?" (Scale 1-10; mild, moderate or severe)   - MILD (1-3):  doesn't interfere with eating or normal activities   - MODERATE (4-7): interferes with eating some solids and normal activities   - SEVERE (8-10):  excruciating pain, interferes with most normal activities   - SEVERE DYSPHAGIA: can't swallow liquids, drooling     Lek endorses pt issues with chewing so "swallowing whole" - moderate 3. SORES: "Are there any  sores or ulcers in the mouth?" If Yes, ask: "What part of the mouth are the sores in?"     denies 4. FEVER: "Do you have a fever?" If Yes, ask: "What is your temperature, how was it measured, and when did it start?"     denies 5. CAUSE: "What do you think is causing the mouth pain?"     unknown 6. OTHER SYMPTOMS: "Do you have any other symptoms?" (e.g., difficulty breathing)     denies  Protocols used: Mouth Pain-A-AH

## 2024-02-09 ENCOUNTER — Ambulatory Visit: Admitting: Family Medicine

## 2024-03-08 ENCOUNTER — Ambulatory Visit: Admitting: Family

## 2024-03-08 VITALS — BP 126/82 | HR 63 | Temp 98.7°F | Ht 62.0 in | Wt 163.2 lb

## 2024-03-08 DIAGNOSIS — I10 Essential (primary) hypertension: Secondary | ICD-10-CM

## 2024-03-08 DIAGNOSIS — R600 Localized edema: Secondary | ICD-10-CM

## 2024-03-08 LAB — COMPREHENSIVE METABOLIC PANEL WITH GFR
ALT: 22 U/L (ref 0–53)
AST: 17 U/L (ref 0–37)
Albumin: 4.3 g/dL (ref 3.5–5.2)
Alkaline Phosphatase: 79 U/L (ref 39–117)
BUN: 10 mg/dL (ref 6–23)
CO2: 30 meq/L (ref 19–32)
Calcium: 9.2 mg/dL (ref 8.4–10.5)
Chloride: 103 meq/L (ref 96–112)
Creatinine, Ser: 0.93 mg/dL (ref 0.40–1.50)
GFR: 90.53 mL/min (ref >60.00)
Glucose, Bld: 91 mg/dL (ref 70–99)
Potassium: 3 meq/L — ABNORMAL LOW (ref 3.5–5.1)
Sodium: 140 meq/L (ref 135–145)
Total Bilirubin: 0.9 mg/dL (ref 0.2–1.2)
Total Protein: 7.8 g/dL (ref 6.0–8.3)

## 2024-03-08 MED ORDER — HYDROCHLOROTHIAZIDE 12.5 MG PO CAPS: 12.5000 mg | ORAL_CAPSULE | Freq: Every day | ORAL | 1 refills | Status: DC

## 2024-03-08 NOTE — Patient Instructions (Signed)
 Recheck in 10-14 days

## 2024-03-08 NOTE — Progress Notes (Signed)
 Acute Office Visit  Subjective:     Patient ID: Daniel Dougherty, male    DOB: [birth year], 58 y.o.   MRN: [id number]  Chief Complaint  Patient presents with   Leg Swelling    Swelling on both feet, been going for three weeks. Swelling of the feet is less in the morning and then progress throughout the day. Pt has tried elevating his and soaking his feet to help with the swelling. When pt touches his legs during the time of swelling, there dents     HPI Patient is in today with complaints of swelling in both lower extremities x 3 weeks.  His legs have become tender.  Patient started amlodipine  5 mg November 14, 2023 through an urgent care visit to treat hypertension.  He has an allergy to lisinopril .  He has also been taking a supplement for left shoulder pain called black hole that he discontinued 1 month ago just in case it was a culprit.  Review of Systems  Respiratory: Negative.    Cardiovascular:  Positive for leg swelling.  Musculoskeletal: Negative.   Neurological: Negative.   Psychiatric/Behavioral: Negative.    All other systems reviewed and are negative.  Past Medical History:  Diagnosis Date   Hypertension    Kidney stones     Social History   Socioeconomic History   Marital status: Married    Spouse name: Not on file   Number of children: Not on file   Years of education: Not on file   Highest education level: Not on file  Occupational History   Not on file  Tobacco Use   Smoking status: Never   Smokeless tobacco: Never  Vaping Use   Vaping status: Never Used  Substance and Sexual Activity   Alcohol use: Not Currently    Comment: Sometimes   Drug use: No   Sexual activity: Not on file  Other Topics Concern   Not on file  Social History Narrative   Not on file   Social Drivers of Health   Financial Resource Strain: Not on file  Food Insecurity: Not on file  Transportation Needs: Not on file  Physical Activity: Not on file  Stress: Not on file  Social  Connections: Not on file  Intimate Partner Violence: Not on file    No past surgical history on file.  No family history on file.  Allergies  Allergen Reactions   Lisinopril      Reports headaches with Lisinopril  per daughter.    Current Outpatient Medications on File Prior to Visit  Medication Sig Dispense Refill   Acetaminophen 325 MG CAPS Take by mouth.     folic acid  (FOLVITE ) 1 MG tablet Take 1 tablet (1 mg total) by mouth daily. 90 tablet 1   Glucosamine HCl (GLUCOSAMINE PO) Take by mouth. [facility]     No current facility-administered medications on file prior to visit.    BP 126/82   Pulse 63   Temp 98.7 F (37.1 C) (Temporal)   Ht 5' 2 (1.575 m)   Wt 163 lb 4 oz (74 kg)   SpO2 96%   BMI 29.86 kg/m chart      Objective:    BP 126/82   Pulse 63   Temp 98.7 F (37.1 C) (Temporal)   Ht 5' 2 (1.575 m)   Wt 163 lb 4 oz (74 kg)   SpO2 96%   BMI 29.86 kg/m    Physical Exam Vitals reviewed.  Constitutional:  Appearance: Normal appearance. He is normal weight.   Cardiovascular:     Rate and Rhythm: Normal rate and regular rhythm.  Pulmonary:     Effort: Pulmonary effort is normal.     Breath sounds: Normal breath sounds.   Musculoskeletal:        General: Swelling present.   Skin:    General: Skin is warm and dry.   Neurological:     General: No focal deficit present.     Mental Status: He is alert and oriented to person, place, and time. Mental status is at baseline.   Psychiatric:        Mood and Affect: Mood normal.        Behavior: Behavior normal.     No results found for any visits on 03/08/24.      Assessment & Plan:   Problem List Items Addressed This Visit     HTN (hypertension)   Relevant Medications   hydrochlorothiazide (MICROZIDE) 12.5 MG capsule   Other Visit Diagnoses       Peripheral edema    -  Primary   Relevant Orders   Comp Met (CMET)       Meds ordered this encounter  Medications    hydrochlorothiazide (MICROZIDE) 12.5 MG capsule    Sig: Take 1 capsule (12.5 mg total) by mouth daily.    Dispense:  30 capsule    Refill:  1   I strongly suspect the reason for his swelling is the amlodipine .  Will discontinue amlodipine .  Start hydrochlorothiazide 12.5 mg once daily to help with the swelling and management of his blood pressure.  CMP sent.  Recheck in 2 weeks to be sure he is tolerating the medication, swelling has resolved, and possibly to repeat chemistries. Return in about 2 weeks (around 03/22/2024).  Logun Colavito B Pink Maye, FNP

## 2024-03-09 ENCOUNTER — Other Ambulatory Visit: Payer: Self-pay | Admitting: Family

## 2024-03-09 ENCOUNTER — Ambulatory Visit: Payer: Self-pay | Admitting: Family

## 2024-03-09 MED ORDER — POTASSIUM CHLORIDE CRYS ER 20 MEQ PO TBCR: 20.0000 meq | EXTENDED_RELEASE_TABLET | Freq: Every day | ORAL | 3 refills | Status: DC

## 2024-03-11 NOTE — Telephone Encounter (Signed)
 Copied from CRM [id number]. Topic: Clinical - Lab/Test Results >> Mar 11, 2024 10:32 AM Aleatha BROCKS wrote: Reason for CRM: Patient daughter called for Lab results  [id number]

## 2024-03-21 NOTE — Progress Notes (Unsigned)
 Acute Office Visit  Subjective:     Patient ID: Daniel Dougherty, male    DOB: 1965/12/20, 58 y.o.   MRN: [id number]  Chief Complaint  Patient presents with   Leg Swelling    Follow up on swelling of both legs, pt stated that both swelling has gone done     HPI  Discussed the use of AI scribe software for clinical note transcription with the patient, who gave verbal consent to proceed.  Daughter is here acting as Equities trader. History of Present Illness Daniel Dougherty is a 58 year old male who presents with foot pain and swelling. He is accompanied by his sister.  Bilateral foot pain and swelling - Persistent swelling and pain in both feet, initially starting from the plantar aspect and now localized to the heels - Pain is worse in the morning and improves slightly with ambulation but persists throughout the day - Works as a Midwife, involving repetitive movements and prolonged standing - Typically goes barefoot at home, which may exacerbate symptoms - Takes acetaminophen 325 mg as needed, cautious about potential liver damage  Fatigue and low energy - Experiences fatigue and low energy in the absence of potassium supplementation - Potassium supplements improve energy levels     ROS Per HPI      Objective:    BP 126/82   Pulse 71   Temp 98.5 F (36.9 C) (Temporal)   Ht 5' 2 (1.575 m)   Wt 159 lb 4 oz (72.2 kg)   SpO2 97%   BMI 29.13 kg/m    Physical Exam Vitals and nursing note reviewed.  Constitutional:      General: He is not in acute distress.    Appearance: Normal appearance.  HENT:     Head: Normocephalic and atraumatic.     Right Ear: External ear normal.     Left Ear: External ear normal.     Nose: Nose normal.     Mouth/Throat:     Mouth: Mucous membranes are moist.     Pharynx: Oropharynx is clear.  Eyes:     Extraocular Movements: Extraocular movements intact.  Cardiovascular:     Rate and Rhythm: Normal rate and regular rhythm.     Pulses: Normal  pulses.     Heart sounds: Normal heart sounds.  Pulmonary:     Effort: Pulmonary effort is normal. No respiratory distress.     Breath sounds: Normal breath sounds. No wheezing, rhonchi or rales.  Musculoskeletal:        General: Normal range of motion.     Cervical back: Normal range of motion.     Right lower leg: No edema.     Left lower leg: No edema.     Comments: +1 nonpitting bilaterally  Lymphadenopathy:     Cervical: No cervical adenopathy.  Skin:    General: Skin is warm and dry.  Neurological:     General: No focal deficit present.     Mental Status: He is alert and oriented to person, place, and time.  Psychiatric:        Mood and Affect: Mood normal.        Behavior: Behavior normal.     No results found for any visits on 03/22/24.      Assessment & Plan:   Assessment and Plan Assessment & Plan Left heel pain Persistent heel pain and swelling, worse in the morning, improved with walking. Cultural practice of not wearing shoes indoors contributes to discomfort. -  Provided exercises and stretches for foot pain management. - Advised wearing supportive footwear indoors, such as Crocs. - Recommended stretching exercises before getting out of bed.  Chronic right shoulder pain Chronic pain managed with acetaminophen. Reassured about safety of current acetaminophen dose. Discussed long-term low-dose use as safe and preferable. - Resume acetaminophen for pain management. - Consider supplements, rest, and stretches. - Use heat therapy for relief. - Consider cortisone injections if pain becomes unbearable.  Hypokalemia Fatigue and low energy improved with potassium supplementation. Previous low potassium levels noted. - Resend potassium prescription.  Lab Orders  No laboratory test(s) ordered today     Meds ordered this encounter  Medications   hydrochlorothiazide  (MICROZIDE ) 12.5 MG capsule    Sig: Take 1 capsule (12.5 mg total) by mouth daily.     Dispense:  30 capsule    Refill:  5   potassium chloride  SA (KLOR-CON  M) 20 MEQ tablet    Sig: Take 1 tablet (20 mEq total) by mouth daily.    Dispense:  30 tablet    Refill:  3    Return in about 6 months (around 09/22/2024) for med mgt.  Corean LITTIE Ku, FNP

## 2024-03-22 ENCOUNTER — Ambulatory Visit: Admitting: Family Medicine

## 2024-03-22 ENCOUNTER — Encounter: Payer: Self-pay | Admitting: Family Medicine

## 2024-03-22 VITALS — BP 126/82 | HR 71 | Temp 98.5°F | Ht 62.0 in | Wt 159.2 lb

## 2024-03-22 DIAGNOSIS — I10 Essential (primary) hypertension: Secondary | ICD-10-CM | POA: Diagnosis not present

## 2024-03-22 DIAGNOSIS — M79672 Pain in left foot: Secondary | ICD-10-CM | POA: Insufficient documentation

## 2024-03-22 DIAGNOSIS — M25511 Pain in right shoulder: Secondary | ICD-10-CM | POA: Diagnosis not present

## 2024-03-22 DIAGNOSIS — E538 Deficiency of other specified B group vitamins: Secondary | ICD-10-CM | POA: Diagnosis not present

## 2024-03-22 DIAGNOSIS — M722 Plantar fascial fibromatosis: Secondary | ICD-10-CM | POA: Insufficient documentation

## 2024-03-22 DIAGNOSIS — G8929 Other chronic pain: Secondary | ICD-10-CM | POA: Insufficient documentation

## 2024-03-22 DIAGNOSIS — D569 Thalassemia, unspecified: Secondary | ICD-10-CM

## 2024-03-22 DIAGNOSIS — E876 Hypokalemia: Secondary | ICD-10-CM | POA: Insufficient documentation

## 2024-03-22 DIAGNOSIS — D563 Thalassemia minor: Secondary | ICD-10-CM

## 2024-03-22 MED ORDER — POTASSIUM CHLORIDE CRYS ER 20 MEQ PO TBCR: 20.0000 meq | EXTENDED_RELEASE_TABLET | Freq: Every day | ORAL | 3 refills | Status: DC

## 2024-03-22 MED ORDER — HYDROCHLOROTHIAZIDE 12.5 MG PO CAPS: 12.5000 mg | ORAL_CAPSULE | Freq: Every day | ORAL | 5 refills | Status: DC

## 2024-03-22 NOTE — Progress Notes (Signed)
 Pt daughter states he no longer needs an interpreter she will be coming to every appointment.Daniel Dougherty

## 2024-03-22 NOTE — Patient Instructions (Addendum)
 Discussed plantar fasciitis, exercise handouts given  Swelling has mostly resolved.

## 2024-04-05 ENCOUNTER — Ambulatory Visit: Admitting: Family Medicine

## 2024-06-27 NOTE — Progress Notes (Signed)
 Established Patient Office Visit  Subjective:     Patient ID: Daniel Dougherty, male    DOB: 03-12-1966, 58 y.o.   MRN: [id number]  Chief Complaint  Patient presents with   Follow-up    Patient here for 3 month f/u for HTN. Refill for folic acid      HPI  Discussed the use of AI scribe software for clinical note transcription with the patient, who gave verbal consent to proceed.  History of Present Illness Daniel Dougherty is a 58 year old male who presents for a follow-up visit and medication refill.  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 - Swelling improves by morning but worsens with extended periods of standing - Manages discomfort with medication and massages, which provide some relief - Not using compression socks  Medication refill needs - Requires refill of folic acid , which ran out at the end of September - Currently taking potassium and hydrochlorothiazide  and needs to ensure adequate refills  Constitutional symptoms - No recent illnesses, fever, or significant changes in health     ROS Per HPI      Objective:    BP 118/76 (BP Location: Left Arm, Patient Position: Sitting, Cuff Size: Normal)   Pulse 65   Temp 98.1 F (36.7 C) (Oral)   Ht 5' 2 (1.575 m)   Wt 157 lb (71.2 kg)   SpO2 95%   BMI 28.72 kg/m    Physical Exam Vitals and nursing note reviewed.  Constitutional:      General: He is not in acute distress.    Appearance: Normal appearance.  HENT:     Head: Normocephalic and atraumatic.     Right Ear: External ear normal.     Left Ear: External ear normal.     Nose: Nose normal.     Mouth/Throat:     Mouth: Mucous membranes are moist.     Pharynx: Oropharynx is clear.  Eyes:     Extraocular Movements: Extraocular movements intact.  Cardiovascular:     Rate and Rhythm: Normal rate and regular rhythm.      Pulses: Normal pulses.     Heart sounds: Normal heart sounds.  Pulmonary:     Effort: Pulmonary effort is normal. No respiratory distress.     Breath sounds: Normal breath sounds. No wheezing, rhonchi or rales.  Musculoskeletal:        General: Normal range of motion.     Cervical back: Normal range of motion.     Right lower leg: No edema.     Left lower leg: No edema.  Lymphadenopathy:     Cervical: No cervical adenopathy.  Skin:    General: Skin is warm and dry.  Neurological:     General: No focal deficit present.     Mental Status: He is alert and oriented to person, place, and time.  Psychiatric:        Mood and Affect: Mood normal.        Behavior: Behavior normal.     Results for orders placed or performed in visit on 06/28/24  CBC with Differential/Platelet  Result Value Ref Range   WBC 10.2 4.0 - 10.5 K/uL   RBC 5.61 4.22 - 5.81 Mil/uL   Hemoglobin 12.5 (L) 13.0 - 17.0 g/dL   HCT 59.4 60.9 - 47.9 %   MCV 72.1 (L) 78.0 -  100.0 fl   MCHC 30.9 30.0 - 36.0 g/dL   RDW 86.0 88.4 - 84.4 %   Platelets 233.0 150.0 - 400.0 K/uL   Neutrophils Relative % 56.3 43.0 - 77.0 %   Lymphocytes Relative 32.1 12.0 - 46.0 %   Monocytes Relative 9.1 3.0 - 12.0 %   Eosinophils Relative 2.1 0.0 - 5.0 %   Basophils Relative 0.4 0.0 - 3.0 %   Neutro Abs 5.7 1.4 - 7.7 K/uL   Lymphs Abs 3.3 0.7 - 4.0 K/uL   Monocytes Absolute 0.9 0.1 - 1.0 K/uL   Eosinophils Absolute 0.2 0.0 - 0.7 K/uL   Basophils Absolute 0.0 0.0 - 0.1 K/uL  Comprehensive metabolic panel with GFR  Result Value Ref Range   Sodium 137 135 - 145 mEq/L   Potassium 3.8 3.5 - 5.1 mEq/L   Chloride 99 96 - 112 mEq/L   CO2 31 19 - 32 mEq/L   Glucose, Bld 82 70 - 99 mg/dL   BUN 16 6 - 23 mg/dL   Creatinine, Ser 9.07 0.40 - 1.50 mg/dL   Total Bilirubin 0.9 0.2 - 1.2 mg/dL   Alkaline Phosphatase 84 39 - 117 U/L   AST 21 0 - 37 U/L   ALT 26 0 - 53 U/L   Total Protein 8.4 (H) 6.0 - 8.3 g/dL   Albumin 4.5 3.5 - 5.2 g/dL    GFR 08.48 >39.99 mL/min   Calcium 9.6 8.4 - 10.5 mg/dL  Vitamin A87  Result Value Ref Range   Vitamin B-12 331 211 - 911 pg/mL  VITAMIN D 25 Hydroxy (Vit-D Deficiency, Fractures)  Result Value Ref Range   VITD 17.05 (L) 30.00 - 100.00 ng/mL  Folate  Result Value Ref Range   Folate 20.1 >5.9 ng/mL    The 10-year ASCVD risk score (Arnett DK, et al., 2019) is: 6.5%  BP Readings from Last 3 Encounters:  06/28/24 118/76  03/22/24 126/82  03/08/24 126/82   Wt Readings from Last 3 Encounters:  06/28/24 157 lb (71.2 kg)  03/22/24 159 lb 4 oz (72.2 kg)  03/08/24 163 lb 4 oz (74 kg)      Last CBC Lab Results  Component Value Date   WBC 10.2 06/28/2024   HGB 12.5 (L) 06/28/2024   HCT 40.5 06/28/2024   MCV 72.1 (L) 06/28/2024   MCH 23.2 (A) 08/12/2016   RDW 13.9 06/28/2024   PLT 233.0 06/28/2024   Last metabolic panel Lab Results  Component Value Date   GLUCOSE 82 06/28/2024   NA 137 06/28/2024   K 3.8 06/28/2024   CL 99 06/28/2024   CO2 31 06/28/2024   BUN 16 06/28/2024   CREATININE 0.92 06/28/2024   GFR 91.51 06/28/2024   CALCIUM 9.6 06/28/2024   PROT 8.4 (H) 06/28/2024   ALBUMIN 4.5 06/28/2024   BILITOT 0.9 06/28/2024   ALKPHOS 84 06/28/2024   AST 21 06/28/2024   ALT 26 06/28/2024   Last lipids Lab Results  Component Value Date   CHOL 193 01/05/2024   HDL 55.80 01/05/2024   LDLCALC 110 (H) 01/05/2024   TRIG 136.0 01/05/2024   CHOLHDL 3 01/05/2024   Last hemoglobin A1c Lab Results  Component Value Date   HGBA1C 5.4 01/05/2024   Last thyroid functions Lab Results  Component Value Date   TSH 1.39 11/17/2023   Last vitamin D Lab Results  Component Value Date   VD25OH 17.05 (L) 06/28/2024   Last vitamin B12 and Folate Lab Results  Component  Value Date   VITAMINB12 331 06/28/2024   FOLATE 20.1 06/28/2024         Assessment & Plan:   Assessment and Plan Assessment & Plan Bilateral foot pain  Chronic pain and swelling after prolonged  standing. Symptoms improve with rest. Declined compression socks. - Encourage use of compression socks.  Hypokalemia Potassium supplementation prescribed. - Refill potassium prescription.  Primary hypertension -Well-controlled with hydrochlorothiazide   Folic acid  deficiency Managed with folic acid  supplementation. Refills provided. - Refill folic acid  prescription.  Thalassemia, hemoglobin constant spring trait CBC today, folate, vitamin D B12, vitamin D today  Vaccine Declined - Declines flu vaccine today     Orders Placed This Encounter  Procedures   CBC with Differential/Platelet    Release to patient:   Immediate [1]   Comprehensive metabolic panel with GFR    Release to patient:   Immediate [1]   Vitamin B12   VITAMIN D 25 Hydroxy (Vit-D Deficiency, Fractures)   Folate     Meds ordered this encounter  Medications   folic acid  (FOLVITE ) 1 MG tablet    Sig: Take 1 tablet (1 mg total) by mouth daily.    Dispense:  90 tablet    Refill:  1   potassium chloride  SA (KLOR-CON  M) 20 MEQ tablet    Sig: Take 1 tablet (20 mEq total) by mouth daily.    Dispense:  34 tablet    Refill:  1   hydrochlorothiazide  (MICROZIDE ) 12.5 MG capsule    Sig: Take 1 capsule (12.5 mg total) by mouth daily.    Dispense:  34 capsule    Refill:  1    Return in about 6 months (around 12/27/2024) for OV meds.  Corean LITTIE Ku, FNP

## 2024-06-28 ENCOUNTER — Encounter: Payer: Self-pay | Admitting: Family Medicine

## 2024-06-28 ENCOUNTER — Ambulatory Visit: Admitting: Family Medicine

## 2024-06-28 VITALS — BP 118/76 | HR 65 | Temp 98.1°F | Ht 62.0 in | Wt 157.0 lb

## 2024-06-28 DIAGNOSIS — M79672 Pain in left foot: Secondary | ICD-10-CM | POA: Diagnosis not present

## 2024-06-28 DIAGNOSIS — M79671 Pain in right foot: Secondary | ICD-10-CM

## 2024-06-28 DIAGNOSIS — E876 Hypokalemia: Secondary | ICD-10-CM | POA: Diagnosis not present

## 2024-06-28 DIAGNOSIS — Z2821 Immunization not carried out because of patient refusal: Secondary | ICD-10-CM

## 2024-06-28 DIAGNOSIS — E538 Deficiency of other specified B group vitamins: Secondary | ICD-10-CM

## 2024-06-28 DIAGNOSIS — I10 Essential (primary) hypertension: Secondary | ICD-10-CM

## 2024-06-28 DIAGNOSIS — D563 Thalassemia minor: Secondary | ICD-10-CM

## 2024-06-28 DIAGNOSIS — D569 Thalassemia, unspecified: Secondary | ICD-10-CM | POA: Diagnosis not present

## 2024-06-28 LAB — VITAMIN B12: Vitamin B-12: 331 pg/mL (ref 211–911)

## 2024-06-28 LAB — COMPREHENSIVE METABOLIC PANEL WITH GFR
ALT: 26 U/L (ref 0–53)
AST: 21 U/L (ref 0–37)
Albumin: 4.5 g/dL (ref 3.5–5.2)
Alkaline Phosphatase: 84 U/L (ref 39–117)
BUN: 16 mg/dL (ref 6–23)
CO2: 31 meq/L (ref 19–32)
Calcium: 9.6 mg/dL (ref 8.4–10.5)
Chloride: 99 meq/L (ref 96–112)
Creatinine, Ser: 0.92 mg/dL (ref 0.40–1.50)
GFR: 91.51 mL/min (ref >60.00)
Glucose, Bld: 82 mg/dL (ref 70–99)
Potassium: 3.8 meq/L (ref 3.5–5.1)
Sodium: 137 meq/L (ref 135–145)
Total Bilirubin: 0.9 mg/dL (ref 0.2–1.2)
Total Protein: 8.4 g/dL — ABNORMAL HIGH (ref 6.0–8.3)

## 2024-06-28 LAB — CBC WITH DIFFERENTIAL/PLATELET
Basophils Absolute: 0 K/uL (ref 0.0–0.1)
Basophils Relative: 0.4 % (ref 0.0–3.0)
Eosinophils Absolute: 0.2 K/uL (ref 0.0–0.7)
Eosinophils Relative: 2.1 % (ref 0.0–5.0)
HCT: 40.5 % (ref 39.0–52.0)
Hemoglobin: 12.5 g/dL — ABNORMAL LOW (ref 13.0–17.0)
Lymphocytes Relative: 32.1 % (ref 12.0–46.0)
Lymphs Abs: 3.3 K/uL (ref 0.7–4.0)
MCHC: 30.9 g/dL (ref 30.0–36.0)
MCV: 72.1 fl — ABNORMAL LOW (ref 78.0–100.0)
Monocytes Absolute: 0.9 K/uL (ref 0.1–1.0)
Monocytes Relative: 9.1 % (ref 3.0–12.0)
Neutro Abs: 5.7 K/uL (ref 1.4–7.7)
Neutrophils Relative %: 56.3 % (ref 43.0–77.0)
Platelets: 233 K/uL (ref 150.0–400.0)
RBC: 5.61 Mil/uL (ref 4.22–5.81)
RDW: 13.9 % (ref 11.5–15.5)
WBC: 10.2 K/uL (ref 4.0–10.5)

## 2024-06-28 LAB — VITAMIN D 25 HYDROXY (VIT D DEFICIENCY, FRACTURES): VITD: 17.05 ng/mL — ABNORMAL LOW (ref 30.00–100.00)

## 2024-06-28 LAB — FOLATE: Folate: 20.1 ng/mL (ref >5.9)

## 2024-06-28 MED ORDER — POTASSIUM CHLORIDE CRYS ER 20 MEQ PO TBCR: 20.0000 meq | EXTENDED_RELEASE_TABLET | Freq: Every day | ORAL | 1 refills | Status: AC

## 2024-06-28 NOTE — Patient Instructions (Signed)
 Continue current medication regimen.   I have sent in refills to your pharmacy.   We are checking labs today, will be in contact with any results that require further attention  Follow-up with me in 6 mos for medication management, sooner if needed.

## 2024-07-01 ENCOUNTER — Ambulatory Visit: Payer: Self-pay | Admitting: Family Medicine

## 2024-07-01 DIAGNOSIS — E559 Vitamin D deficiency, unspecified: Secondary | ICD-10-CM

## 2024-07-01 MED ORDER — VITAMIN D (ERGOCALCIFEROL) 1.25 MG (50000 UNIT) PO CAPS: 50000.0000 [IU] | ORAL_CAPSULE | ORAL | 0 refills | Status: AC

## 2024-07-05 ENCOUNTER — Telehealth: Payer: Self-pay

## 2024-07-05 NOTE — Telephone Encounter (Signed)
 Spoke with patients daughter, advised her to reach out to the pharmacy because we sent in a script for 8 pillls. I attempted to reach the pharmacy and was placed on hold for 10 minutes, I disconnected the call.

## 2024-07-05 NOTE — Telephone Encounter (Signed)
 Copied from CRM [id number]. Topic: Clinical - Lab/Test Results >> Jul 05, 2024 11:42 AM Viola FALCON wrote: Patient returned [facility] phone call, I relayed lab results. Patient only received 4 pills for the vitamin b12 and needs another 4 pills sent to pharmacy to complete 8 week

## 2024-08-02 ENCOUNTER — Other Ambulatory Visit: Payer: Self-pay | Admitting: Family Medicine

## 2024-08-02 DIAGNOSIS — E559 Vitamin D deficiency, unspecified: Secondary | ICD-10-CM

## 2024-12-27 ENCOUNTER — Ambulatory Visit: Admitting: Family Medicine
